# Patient Record
Sex: Male | Born: 1975 | Race: White | Hispanic: No | Marital: Married | Smoking: Never smoker
Health system: Southern US, Community
[De-identification: ages and names within clinical notes are randomized; demographics above are authoritative.]

## PROBLEM LIST (undated history)

## (undated) DIAGNOSIS — R0600 Dyspnea, unspecified: Secondary | ICD-10-CM

## (undated) DIAGNOSIS — F419 Anxiety disorder, unspecified: Secondary | ICD-10-CM

## (undated) DIAGNOSIS — Z87442 Personal history of urinary calculi: Secondary | ICD-10-CM

## (undated) DIAGNOSIS — M109 Gout, unspecified: Secondary | ICD-10-CM

## (undated) DIAGNOSIS — G5602 Carpal tunnel syndrome, left upper limb: Secondary | ICD-10-CM

## (undated) DIAGNOSIS — N200 Calculus of kidney: Secondary | ICD-10-CM

## (undated) DIAGNOSIS — K219 Gastro-esophageal reflux disease without esophagitis: Secondary | ICD-10-CM

## (undated) DIAGNOSIS — I10 Essential (primary) hypertension: Secondary | ICD-10-CM

## (undated) DIAGNOSIS — E119 Type 2 diabetes mellitus without complications: Secondary | ICD-10-CM

## (undated) DIAGNOSIS — G4733 Obstructive sleep apnea (adult) (pediatric): Secondary | ICD-10-CM

## (undated) DIAGNOSIS — M5387 Other specified dorsopathies, lumbosacral region: Secondary | ICD-10-CM

## (undated) DIAGNOSIS — L0390 Cellulitis, unspecified: Secondary | ICD-10-CM

## (undated) DIAGNOSIS — R519 Headache, unspecified: Secondary | ICD-10-CM

## (undated) DIAGNOSIS — J45909 Unspecified asthma, uncomplicated: Secondary | ICD-10-CM

## (undated) DIAGNOSIS — K589 Irritable bowel syndrome without diarrhea: Secondary | ICD-10-CM

## (undated) DIAGNOSIS — J189 Pneumonia, unspecified organism: Secondary | ICD-10-CM

## (undated) DIAGNOSIS — R51 Headache: Secondary | ICD-10-CM

## (undated) HISTORY — DX: Calculus of kidney: N20.0

## (undated) HISTORY — DX: Gout, unspecified: M10.9

## (undated) HISTORY — DX: Type 2 diabetes mellitus without complications: E11.9

## (undated) HISTORY — DX: Dyspnea, unspecified: R06.00

## (undated) HISTORY — DX: Other specified dorsopathies, lumbosacral region: M53.87

## (undated) HISTORY — DX: Obstructive sleep apnea (adult) (pediatric): G47.33

---
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
## 2004-06-12 ENCOUNTER — Ambulatory Visit: Payer: Self-pay | Admitting: Family Medicine

## 2004-10-18 ENCOUNTER — Ambulatory Visit: Payer: Self-pay | Admitting: Family Medicine

## 2004-11-07 ENCOUNTER — Encounter: Admission: RE | Admit: 2004-11-07 | Discharge: 2004-11-07 | Payer: Self-pay | Admitting: Occupational Medicine

## 2004-12-05 ENCOUNTER — Ambulatory Visit: Payer: Self-pay | Admitting: Family Medicine

## 2005-03-08 ENCOUNTER — Ambulatory Visit: Payer: Self-pay | Admitting: Family Medicine

## 2005-07-02 ENCOUNTER — Ambulatory Visit: Payer: Self-pay | Admitting: Family Medicine

## 2006-05-31 ENCOUNTER — Emergency Department (HOSPITAL_COMMUNITY): Admission: EM | Admit: 2006-05-31 | Discharge: 2006-05-31 | Payer: Self-pay | Admitting: Emergency Medicine

## 2008-11-05 ENCOUNTER — Emergency Department (HOSPITAL_COMMUNITY): Admission: EM | Admit: 2008-11-05 | Discharge: 2008-11-06 | Payer: Self-pay | Admitting: Emergency Medicine

## 2013-11-24 ENCOUNTER — Emergency Department: Payer: Self-pay | Admitting: Emergency Medicine

## 2013-11-24 LAB — CBC
HCT: 42.8 % (ref 40.0–52.0)
HGB: 14.2 g/dL (ref 13.0–18.0)
MCH: 29.4 pg (ref 26.0–34.0)
MCHC: 33.2 g/dL (ref 32.0–36.0)
MCV: 89 fL (ref 80–100)
Platelet: 184 10*3/uL (ref 150–440)
RBC: 4.83 10*6/uL (ref 4.40–5.90)
RDW: 13.8 % (ref 11.5–14.5)
WBC: 11.5 10*3/uL — AB (ref 3.8–10.6)

## 2013-11-24 LAB — BASIC METABOLIC PANEL
ANION GAP: 8 (ref 7–16)
BUN: 15 mg/dL (ref 7–18)
CALCIUM: 9 mg/dL (ref 8.5–10.1)
CHLORIDE: 105 mmol/L (ref 98–107)
Co2: 26 mmol/L (ref 21–32)
Creatinine: 1.42 mg/dL — ABNORMAL HIGH (ref 0.60–1.30)
Glucose: 141 mg/dL — ABNORMAL HIGH (ref 65–99)
Osmolality: 281 (ref 275–301)
Potassium: 3 mmol/L — ABNORMAL LOW (ref 3.5–5.1)
Sodium: 139 mmol/L (ref 136–145)

## 2013-11-24 LAB — TROPONIN I: TROPONIN-I: 0.04 ng/mL

## 2013-11-25 LAB — TROPONIN I: Troponin-I: 0.04 ng/mL

## 2013-11-25 LAB — D-DIMER(ARMC): D-Dimer: 345 ng/ml

## 2014-01-21 ENCOUNTER — Encounter (HOSPITAL_COMMUNITY): Payer: Self-pay | Admitting: Emergency Medicine

## 2014-01-21 ENCOUNTER — Inpatient Hospital Stay (HOSPITAL_COMMUNITY)
Admission: EM | Admit: 2014-01-21 | Discharge: 2014-01-22 | DRG: 194 | Disposition: A | Discharge status: Home / Self Care | Payer: Managed Care, Other (non HMO) | Attending: Internal Medicine | Admitting: Internal Medicine

## 2014-01-21 ENCOUNTER — Emergency Department (HOSPITAL_COMMUNITY): Payer: Managed Care, Other (non HMO)

## 2014-01-21 DIAGNOSIS — Z8249 Family history of ischemic heart disease and other diseases of the circulatory system: Secondary | ICD-10-CM

## 2014-01-21 DIAGNOSIS — E785 Hyperlipidemia, unspecified: Secondary | ICD-10-CM | POA: Diagnosis present

## 2014-01-21 DIAGNOSIS — I129 Hypertensive chronic kidney disease with stage 1 through stage 4 chronic kidney disease, or unspecified chronic kidney disease: Secondary | ICD-10-CM | POA: Diagnosis present

## 2014-01-21 DIAGNOSIS — N189 Chronic kidney disease, unspecified: Secondary | ICD-10-CM | POA: Diagnosis present

## 2014-01-21 DIAGNOSIS — Z6841 Body Mass Index (BMI) 40.0 and over, adult: Secondary | ICD-10-CM | POA: Diagnosis not present

## 2014-01-21 DIAGNOSIS — I509 Heart failure, unspecified: Secondary | ICD-10-CM | POA: Diagnosis present

## 2014-01-21 DIAGNOSIS — G4733 Obstructive sleep apnea (adult) (pediatric): Secondary | ICD-10-CM | POA: Diagnosis present

## 2014-01-21 DIAGNOSIS — R079 Chest pain, unspecified: Secondary | ICD-10-CM | POA: Diagnosis present

## 2014-01-21 DIAGNOSIS — R071 Chest pain on breathing: Secondary | ICD-10-CM | POA: Diagnosis present

## 2014-01-21 DIAGNOSIS — J189 Pneumonia, unspecified organism: Secondary | ICD-10-CM | POA: Diagnosis not present

## 2014-01-21 DIAGNOSIS — R7309 Other abnormal glucose: Secondary | ICD-10-CM | POA: Diagnosis present

## 2014-01-21 DIAGNOSIS — N179 Acute kidney failure, unspecified: Secondary | ICD-10-CM | POA: Diagnosis present

## 2014-01-21 DIAGNOSIS — I5030 Unspecified diastolic (congestive) heart failure: Secondary | ICD-10-CM | POA: Diagnosis present

## 2014-01-21 DIAGNOSIS — I10 Essential (primary) hypertension: Secondary | ICD-10-CM

## 2014-01-21 DIAGNOSIS — K589 Irritable bowel syndrome without diarrhea: Secondary | ICD-10-CM | POA: Diagnosis present

## 2014-01-21 DIAGNOSIS — I517 Cardiomegaly: Secondary | ICD-10-CM

## 2014-01-21 HISTORY — DX: Essential (primary) hypertension: I10

## 2014-01-21 LAB — I-STAT CHEM 8, ED
BUN: 13 mg/dL (ref 6–23)
CALCIUM ION: 1.13 mmol/L (ref 1.12–1.23)
CHLORIDE: 104 meq/L (ref 96–112)
CREATININE: 1.6 mg/dL — AB (ref 0.50–1.35)
GLUCOSE: 144 mg/dL — AB (ref 70–99)
HCT: 44 % (ref 39.0–52.0)
Hemoglobin: 15 g/dL (ref 13.0–17.0)
Potassium: 3.5 mEq/L — ABNORMAL LOW (ref 3.7–5.3)
Sodium: 140 mEq/L (ref 137–147)
TCO2: 24 mmol/L (ref 0–100)

## 2014-01-21 LAB — I-STAT TROPONIN, ED: Troponin i, poc: 0 ng/mL (ref 0.00–0.08)

## 2014-01-21 LAB — CBC
HCT: 41.7 % (ref 39.0–52.0)
Hemoglobin: 14 g/dL (ref 13.0–17.0)
MCH: 30.2 pg (ref 26.0–34.0)
MCHC: 33.6 g/dL (ref 30.0–36.0)
MCV: 90.1 fL (ref 78.0–100.0)
Platelets: 246 10*3/uL (ref 150–400)
RBC: 4.63 MIL/uL (ref 4.22–5.81)
RDW: 13.4 % (ref 11.5–15.5)
WBC: 10.9 10*3/uL — ABNORMAL HIGH (ref 4.0–10.5)

## 2014-01-21 LAB — D-DIMER, QUANTITATIVE (NOT AT ARMC): D DIMER QUANT: 0.34 ug{FEU}/mL (ref 0.00–0.48)

## 2014-01-21 LAB — TROPONIN I
Troponin I: <0.3 ng/mL (ref <0.30)
Troponin I: <0.3 ng/mL (ref <0.30)

## 2014-01-21 LAB — CBC WITH DIFFERENTIAL/PLATELET
Basophils Absolute: 0.1 10*3/uL (ref 0.0–0.1)
Basophils Relative: 1 % (ref 0–1)
EOS ABS: 0.3 10*3/uL (ref 0.0–0.7)
EOS PCT: 2 % (ref 0–5)
HEMATOCRIT: 40.7 % (ref 39.0–52.0)
Hemoglobin: 13.6 g/dL (ref 13.0–17.0)
LYMPHS ABS: 1.9 10*3/uL (ref 0.7–4.0)
LYMPHS PCT: 17 % (ref 12–46)
MCH: 30 pg (ref 26.0–34.0)
MCHC: 33.4 g/dL (ref 30.0–36.0)
MCV: 89.8 fL (ref 78.0–100.0)
MONO ABS: 1.1 10*3/uL — AB (ref 0.1–1.0)
Monocytes Relative: 10 % (ref 3–12)
NEUTROS ABS: 8 10*3/uL — AB (ref 1.7–7.7)
NEUTROS PCT: 70 % (ref 43–77)
PLATELETS: 266 10*3/uL (ref 150–400)
RBC: 4.53 MIL/uL (ref 4.22–5.81)
RDW: 13.5 % (ref 11.5–15.5)
WBC: 11.3 10*3/uL — ABNORMAL HIGH (ref 4.0–10.5)

## 2014-01-21 LAB — CREATININE, SERUM
Creatinine, Ser: 1.03 mg/dL (ref 0.50–1.35)
GFR calc non Af Amer: >90 mL/min (ref >90)

## 2014-01-21 LAB — HEMOGLOBIN A1C
HEMOGLOBIN A1C: 6.1 % — AB (ref <5.7)
Mean Plasma Glucose: 128 mg/dL — ABNORMAL HIGH (ref <117)

## 2014-01-21 LAB — PRO B NATRIURETIC PEPTIDE: Pro B Natriuretic peptide (BNP): 182.5 pg/mL — ABNORMAL HIGH (ref 0–125)

## 2014-01-21 LAB — MRSA PCR SCREENING: MRSA by PCR: NEGATIVE

## 2014-01-21 MED ORDER — ALBUTEROL SULFATE (2.5 MG/3ML) 0.083% IN NEBU
5.0000 mg | INHALATION_SOLUTION | Freq: Once | RESPIRATORY_TRACT | Status: AC
  Administered 2014-01-21: 5 mg via RESPIRATORY_TRACT
  Filled 2014-01-21: qty 6

## 2014-01-21 MED ORDER — MORPHINE SULFATE 2 MG/ML IJ SOLN
2.0000 mg | INTRAMUSCULAR | Status: DC | PRN
  Administered 2014-01-21 – 2014-01-22 (×3): 2 mg via INTRAVENOUS
  Filled 2014-01-21 (×3): qty 1

## 2014-01-21 MED ORDER — SODIUM CHLORIDE 0.9 % IV SOLN: INTRAVENOUS | Status: DC

## 2014-01-21 MED ORDER — IOHEXOL 350 MG/ML SOLN
80.0000 mL | Freq: Once | INTRAVENOUS | Status: AC | PRN
  Administered 2014-01-21: 80 mL via INTRAVENOUS

## 2014-01-21 MED ORDER — CARVEDILOL 6.25 MG PO TABS
6.2500 mg | ORAL_TABLET | Freq: Two times a day (BID) | ORAL | Status: DC
  Administered 2014-01-21 – 2014-01-22 (×2): 6.25 mg via ORAL
  Filled 2014-01-21 (×4): qty 1

## 2014-01-21 MED ORDER — ONDANSETRON HCL 4 MG PO TABS: 4.0000 mg | ORAL_TABLET | Freq: Four times a day (QID) | ORAL | Status: DC | PRN

## 2014-01-21 MED ORDER — ATORVASTATIN CALCIUM 40 MG PO TABS
40.0000 mg | ORAL_TABLET | Freq: Every day | ORAL | Status: DC
  Administered 2014-01-21 – 2014-01-22 (×2): 40 mg via ORAL
  Filled 2014-01-21 (×2): qty 1

## 2014-01-21 MED ORDER — ACETAMINOPHEN 650 MG RE SUPP: 650.0000 mg | Freq: Four times a day (QID) | RECTAL | Status: DC | PRN

## 2014-01-21 MED ORDER — ASPIRIN EC 325 MG PO TBEC
325.0000 mg | DELAYED_RELEASE_TABLET | Freq: Every day | ORAL | Status: DC
Start: 2014-01-21 — End: 2014-01-22
  Administered 2014-01-21 – 2014-01-22 (×2): 325 mg via ORAL
  Filled 2014-01-21 (×2): qty 1

## 2014-01-21 MED ORDER — HYDROMORPHONE HCL PF 1 MG/ML IJ SOLN
1.0000 mg | Freq: Once | INTRAMUSCULAR | Status: AC
  Administered 2014-01-21: 1 mg via INTRAVENOUS
  Filled 2014-01-21: qty 1

## 2014-01-21 MED ORDER — METHYLPREDNISOLONE SODIUM SUCC 125 MG IJ SOLR
125.0000 mg | Freq: Once | INTRAMUSCULAR | Status: AC
  Administered 2014-01-21: 125 mg via INTRAVENOUS
  Filled 2014-01-21: qty 2

## 2014-01-21 MED ORDER — ALBUTEROL SULFATE (2.5 MG/3ML) 0.083% IN NEBU: 2.5000 mg | INHALATION_SOLUTION | RESPIRATORY_TRACT | Status: DC | PRN

## 2014-01-21 MED ORDER — NITROGLYCERIN 0.4 MG SL SUBL: 0.4000 mg | SUBLINGUAL_TABLET | SUBLINGUAL | Status: DC | PRN

## 2014-01-21 MED ORDER — SODIUM CHLORIDE 0.9 % IJ SOLN
3.0000 mL | Freq: Two times a day (BID) | INTRAMUSCULAR | Status: DC
  Administered 2014-01-21 – 2014-01-22 (×2): 3 mL via INTRAVENOUS

## 2014-01-21 MED ORDER — NITROGLYCERIN IN D5W 200-5 MCG/ML-% IV SOLN
2.0000 ug/min | INTRAVENOUS | Status: DC
  Administered 2014-01-21: 5 ug/min via INTRAVENOUS
  Filled 2014-01-21: qty 250

## 2014-01-21 MED ORDER — ONDANSETRON HCL 4 MG/2ML IJ SOLN: 4.0000 mg | Freq: Four times a day (QID) | INTRAMUSCULAR | Status: DC | PRN

## 2014-01-21 MED ORDER — PERFLUTREN LIPID MICROSPHERE
1.0000 mL | Freq: Once | INTRAVENOUS | Status: AC
  Administered 2014-01-21: 1 mL via INTRAVENOUS
  Filled 2014-01-21: qty 10

## 2014-01-21 MED ORDER — MORPHINE SULFATE 4 MG/ML IJ SOLN
4.0000 mg | Freq: Once | INTRAMUSCULAR | Status: DC
  Filled 2014-01-21: qty 1

## 2014-01-21 MED ORDER — OXYCODONE HCL 5 MG PO TABS
5.0000 mg | ORAL_TABLET | ORAL | Status: DC | PRN
  Administered 2014-01-22 (×2): 5 mg via ORAL
  Filled 2014-01-21 (×2): qty 1

## 2014-01-21 MED ORDER — ENOXAPARIN SODIUM 40 MG/0.4ML ~~LOC~~ SOLN
40.0000 mg | SUBCUTANEOUS | Status: DC
  Administered 2014-01-21: 40 mg via SUBCUTANEOUS
  Filled 2014-01-21 (×2): qty 0.4

## 2014-01-21 MED ORDER — PERFLUTREN LIPID MICROSPHERE
1.0000 mL | INTRAVENOUS | Status: AC | PRN
  Filled 2014-01-21: qty 10

## 2014-01-21 MED ORDER — SODIUM CHLORIDE 0.9 % IV BOLUS (SEPSIS): 1000.0000 mL | Freq: Once | INTRAVENOUS | Status: DC

## 2014-01-21 MED ORDER — KETOROLAC TROMETHAMINE 30 MG/ML IJ SOLN
30.0000 mg | Freq: Once | INTRAMUSCULAR | Status: AC
  Administered 2014-01-21: 30 mg via INTRAVENOUS
  Filled 2014-01-21: qty 1

## 2014-01-21 MED ORDER — PANTOPRAZOLE SODIUM 40 MG PO TBEC
40.0000 mg | DELAYED_RELEASE_TABLET | Freq: Every day | ORAL | Status: DC
  Administered 2014-01-21 – 2014-01-22 (×2): 40 mg via ORAL
  Filled 2014-01-21 (×2): qty 1

## 2014-01-21 MED ORDER — GI COCKTAIL ~~LOC~~
30.0000 mL | Freq: Once | ORAL | Status: AC
  Administered 2014-01-21: 30 mL via ORAL
  Filled 2014-01-21: qty 30

## 2014-01-21 MED ORDER — ISOSORBIDE MONONITRATE ER 30 MG PO TB24
30.0000 mg | ORAL_TABLET | Freq: Every day | ORAL | Status: DC
  Administered 2014-01-21: 30 mg via ORAL
  Filled 2014-01-21 (×2): qty 1

## 2014-01-21 MED ORDER — ACETAMINOPHEN 500 MG PO TABS: 1000.0000 mg | ORAL_TABLET | Freq: Once | ORAL | Status: DC

## 2014-01-21 MED ORDER — ACETAMINOPHEN 325 MG PO TABS: 650.0000 mg | ORAL_TABLET | Freq: Four times a day (QID) | ORAL | Status: DC | PRN

## 2014-01-21 MED ORDER — LEVOFLOXACIN IN D5W 500 MG/100ML IV SOLN
500.0000 mg | INTRAVENOUS | Status: DC
  Administered 2014-01-21: 500 mg via INTRAVENOUS
  Filled 2014-01-21 (×2): qty 100

## 2014-01-21 MED ORDER — GUAIFENESIN-DM 100-10 MG/5ML PO SYRP
5.0000 mL | ORAL_SOLUTION | ORAL | Status: DC | PRN
  Filled 2014-01-21: qty 5

## 2014-01-21 MED ORDER — MORPHINE SULFATE 2 MG/ML IJ SOLN
1.0000 mg | INTRAMUSCULAR | Status: DC | PRN
  Administered 2014-01-21 (×2): 1 mg via INTRAVENOUS
  Filled 2014-01-21 (×2): qty 1

## 2014-01-21 NOTE — Progress Notes (Signed)
Pt c/o chest pain 6/10 throughout shift. Nitro titrated to 6525mcg/min. Pt sleeps briefly and awakens with chest pain. Some improvement noted when OOB in chair. MD notified at 16:00. Family remain at bedside.

## 2014-01-21 NOTE — ED Notes (Signed)
Pt arrives via EMS from home. States that he went to lay down and had a sudden onset of chest pain, shortness of breath, diaphoresis, nausea and vomitting. Says that he also started coughing up bright red sputum which has since subsided. States that same thing happened the other day and it went away after a few minutes. EMS gave pt 324 ASA and 2 NTG with no changes in pain. Pain is 8/10. EMS also gave 4mg  Zofran which has helped the nausea. States he has a hx of swollen legs and HTN. States he takes HCTZ, carvedilol and lisinopril. States he was started on a new BP medicine but hasn't taken it in a few weeks because he couldn't tell the difference. EMS reports 12 lead unremarkable. Initial BP 196/106, currently 157/95.

## 2014-01-21 NOTE — Progress Notes (Signed)
ANTIBIOTIC CONSULT NOTE - INITIAL  Pharmacy Consult for levaquin Indication: CAP  Allergies  Allergen Reactions  . Penicillins Hives    Patient Measurements: Height: 5\' 8"  (172.7 cm) Weight: 364 lb 4.8 oz (165.245 kg) IBW/kg (Calculated) : 68.4  Vital Signs: Temp: 97.8 F (36.6 C) (07/30 1216) Temp src: Oral (07/30 1216) BP: 156/95 mmHg (07/30 1216) Pulse Rate: 76 (07/30 1216) Intake/Output from previous day:   Intake/Output from this shift: Total I/O In: 17.6 [I.V.:17.6] Out: -   Labs:  Recent Labs  01/21/14 0128 01/21/14 0137  WBC 11.3*  --   HGB 13.6 15.0  PLT 266  --   CREATININE  --  1.60*   Estimated Creatinine Clearance: 94.8 ml/min (by C-G formula based on Cr of 1.6). No results found for this basename: VANCOTROUGH, Leodis BinetVANCOPEAK, VANCORANDOM, GENTTROUGH, GENTPEAK, GENTRANDOM, TOBRATROUGH, TOBRAPEAK, TOBRARND, AMIKACINPEAK, AMIKACINTROU, AMIKACIN,  in the last 72 hours   Microbiology: Recent Results (from the past 720 hour(s))  MRSA PCR SCREENING     Status: None   Collection Time    01/21/14 10:00 AM      Result Value Ref Range Status   MRSA by PCR NEGATIVE  NEGATIVE Final   Comment:            The GeneXpert MRSA Assay (FDA     approved for NASAL specimens     only), is one component of a     comprehensive MRSA colonization     surveillance program. It is not     intended to diagnose MRSA     infection nor to guide or     monitor treatment for     MRSA infections.    Medical History: Past Medical History  Diagnosis Date  . Hypertension   . Swelling of lower extremity     Medications:  Prescriptions prior to admission  Medication Sig Dispense Refill  . carvedilol (COREG) 25 MG tablet Take 25 mg by mouth 2 (two) times daily with a meal.      . ibuprofen (ADVIL,MOTRIN) 200 MG tablet Take 400 mg by mouth every 6 (six) hours as needed for moderate pain.      Marland Kitchen. lisinopril-hydrochlorothiazide (PRINZIDE,ZESTORETIC) 20-12.5 MG per tablet Take 1  tablet by mouth daily.      Marland Kitchen. losartan-hydrochlorothiazide (HYZAAR) 100-25 MG per tablet Take 1 tablet by mouth daily.        Assessment: 38 yo male here with CP and SOB and possible PNA to begin levaquin per pharmacy.  WBC= 11.3, SCr= 1.6, CrCl ~ 95.   Plan:  -Levaquin 500mg  IV q24hr -Will follow renal function, cultures and clinical progress  Harland Germanndrew Laraina Sulton, Pharm D 01/21/2014 2:22 PM

## 2014-01-21 NOTE — H&P (Addendum)
PATIENT DETAILS Name: Troy Rogers Age: 38 y.o. Sex: male Date of Birth: 1976-03-01 Admit Date: 01/21/2014 PCP:No PCP Per Patient   CHIEF COMPLAINT:  Chest pain and hemoptysis   HPI: Troy Rogers is a 38 y.o. morbidly obese male with a Past Medical History of Hypertension and IBS who presents today with the above noted complaint.  States he was sleeping last night and suddenly could not breath.  Felt like there was a "car sitting on his chest." Abruptly sat up and started to cough up blood.  Describes it as "chunks" of blood that later became pinking phlegm.  States pain is right in the middle of his chest and does not radiate.  Patient became diaphoretic and nausea, but did not vomit.  Denies previous history of similar episodes.  No recent change in activity or diet.  Rates the pain after Nitro administration a 6-6.5/10.  No hx of obstructive apnea and does not wear a cpap.  Previously negative sleep study.     Denies any history of fever, does have a dry cough.   ALLERGIES:   Allergies  Allergen Reactions  . Penicillins Hives    PAST MEDICAL HISTORY: Past Medical History  Diagnosis Date  . Hypertension   . Swelling of lower extremity     PAST SURGICAL HISTORY: History reviewed. No pertinent past surgical history.  MEDICATIONS AT HOME: Prior to Admission medications   Medication Sig Start Date End Date Taking? Authorizing Provider  CARVEDILOL PO Take 1 tablet by mouth 2 (two) times daily. Strength unknown   Yes Historical Provider, MD  ibuprofen (ADVIL,MOTRIN) 200 MG tablet Take 400 mg by mouth every 6 (six) hours as needed for moderate pain.   Yes Historical Provider, MD  LISINOPRIL-HYDROCHLOROTHIAZIDE PO Take 1 tablet by mouth daily. Strength unknown   Yes Historical Provider, MD  PRESCRIPTION MEDICATION Take 1 tablet by mouth daily. Unknown name of blood pressure medication   Yes Historical Provider, MD    FAMILY HISTORY: History reviewed. Maternal and  Paternal grandfathers with heart disease.  Both grandmothers with cancer.  Neither parents with any significant medical conditions.  SOCIAL HISTORY:  reports that he has never smoked. He has never used smokeless tobacco. He reports that he drinks alcohol. He reports that he does not use illicit drugs.  REVIEW OF SYSTEMS:  Constitutional:   No  weight loss, Fevers, chills, fatigue.  HEENT:    Difficulty swallowing, Sore throat,  No sneezing, itching, ear ache, nasal congestion, post nasal drip,   Cardio-vascular: No Orthopnea, anasarca, dizziness, palpitations  GI:  No heartburn, indigestion, abdominal pain, vomiting, diarrhea, change in bowel habits, loss of appetite  Resp: No excess mucus, No wheezing. No chest wall deformity  Skin:  no rash or lesions.  GU:  no dysuria, change in color of urine, no urgency or frequency.  No flank pain.  Musculoskeletal: No joint pain or swelling.  No decreased range of motion.  No back pain.  Psych: No change in mood or affect. No depression or anxiety.  No memory loss.   PHYSICAL EXAM: Blood pressure 155/84, pulse 73, temperature 98.2 F (36.8 C), temperature source Oral, resp. rate 21, SpO2 96.00%.  General appearance :somnloent but arousable, alert, not in any distress. Speech Clear. Not toxic Looking. Mobidly obese sitting up in bed HEENT: Atraumatic and Normocephalic, pupils equally reactive to light and accomodation Neck: supple, no JVD. No cervical lymphadenopathy.  Chest: Good air entry bilaterally, mild  wheezing noted. No crackles or rales CVS: S1 S2 regular, no murmurs.  Abdomen: Bowel sounds present, Non tender and not distended with no gaurding, rigidity or rebound. Extremities: B/L Lower Ext shows mild edema, both legs are warm to touch Neurology: oriented X 3, CN II-XII intact, Non focal Skin:No Rash   LABS ON ADMISSION:   Recent Labs  01/21/14 0137  NA 140  K 3.5*  CL 104  GLUCOSE 144*  BUN 13  CREATININE  1.60*   No results found for this basename: AST, ALT, ALKPHOS, BILITOT, PROT, ALBUMIN,  in the last 72 hours No results found for this basename: LIPASE, AMYLASE,  in the last 72 hours  Recent Labs  01/21/14 0128 01/21/14 0137  WBC 11.3*  --   NEUTROABS 8.0*  --   HGB 13.6 15.0  HCT 40.7 44.0  MCV 89.8  --   PLT 266  --    No results found for this basename: CKTOTAL, CKMB, CKMBINDEX, TROPONINI,  in the last 72 hours  Recent Labs  01/21/14 0323  DDIMER 0.34   No components found with this basename: POCBNP,    RADIOLOGIC STUDIES ON ADMISSION: Dg Chest 2 View  01/21/2014   CLINICAL DATA:  Shortness of breath and chest pain.  Hemoptysis.  EXAM: CHEST  2 VIEW  COMPARISON:  11/24/2013  FINDINGS: The heart size and mediastinal contours are within normal limits. Both lungs are clear. The visualized skeletal structures are unremarkable.  IMPRESSION: Normal exam.   Electronically Signed   By: Jim  Maxwell M.D.   On: 01/21/2014 02:15   Ct Angio Chest Pe W/cm &/or Wo Cm  01/21/2014   CLINICAL DATA:  Shortness of breath and chest pain.  EXAM: CT ANGIOGRAPHY CHEST WITH CONTRAST  TECHNIQUE: Multidetector CT imaging of the chest was performed using the standard protocol during bolus administration of intravenous contrast. Multiplanar CT image reconstructions and MIPs were obtained to evaluate the vascular anatomy.  CONTRAST:  65mLMa1610DerwoMedstar Surgery Center At 26mimMa1610DerwoVance Thompson [facility] Bill59mngMa1610DerwoSaint Clares Hospital - Susse60m CMa1610DerwoWest Park Surgery C6mntMa1610DerwoAv[place] 68mosMa1610DerwoGreen Valley Surger74m CMa1610DerwoAd[facility] 52mosMa1610DerwoVictor Valley Global Medica34m CMa1610DerwoAlaska Digestiv29m CMa1610DerwoAspire Behavioral Health O48m CMa1610DerwoNorth Valley Endoscop89m CMa1610DerwoAlvarado Parkway Institut13m BMa1610DerwoSurgery Center Of Cul62mmaMa1610DerwoGrand Street Gastroentero36mogMa1610DerwoEncompass Health [facility] Of5mAbMa1610DerwoPres[facility] 49mosMa1610DerwoUpmc 47memMa1610DerwoLandmark Surger107m CMa1610DerwoBroward Health Imperi11ml Ma1610DerwoChildrens Medical Cent361610De77mwoMa1610DerwoKindred Hospital - [facility] - Morri64m CMa1610DerwoBay [facility] Medical87mCeMa1610DerwoWhite Flint Sur34merMa1610DerwoSw[facility] 43mosMa1610DerwoSurgcenter Of Silver Sp48minMa1610DerwoMain Line Endoscopy Center SouthplanE IOHEXOL 350 MG/ML SOLN  COMPARISON:  Chest x-ray 01/21/2014.  FINDINGS: Thoracic aorta is unremarkable. Great vessels patent. Pulmonary arteries are normal. No pulmonary embolus. Cardiomegaly. No pericardial effusion .  No significant mediastinal or hilar adenopathy. Thoracic esophagus is normal.  Large airways are patent. Minimal interstitial prominence is noted throughout the lungs. These findings suggest process such as interstitial edema from congestive heart failure or pneumonitis.  Visualized upper abdominal structures are unremarkable.  Visualized thyroid  unremarkable. No supraclavicular or axillary adenopathy. Degenerative changes thoracic spine. No acute bony abnormality.  Review of the MIP images confirms the above findings.  IMPRESSION: 1. Cardiomegaly with mild interstitial prominence suggesting congestive heart failure. Pneumonitis cannot be excluded. 2. No evidence of pulmonary embolus.   Electronically Signed   By: Thomas  Register   On: 01/21/2014 07:12     EKG: Independently reviewed.   ASSESSMENT AND PLAN: Present on Admission:  . Chest pain with shortness of breath -Suspecting cardiology etiology with patient's history of chest pain, SOB, diaphoresis, and nausea. Also has questionable atypical pneumonia on CT chest, do not think this accounts for all of the patient's symptoms. -Cardiology consulted. On Nitro drip.  Put on NPO for now till seen by cardiology. Check echocardiogram, however BNP only minimally elevated. -EKG shows no abnormalities.  Troponin pending.  CT shows no evidence of Pulmonary Embolus  . Possible atypical pneumonia -CT cannot rule out pneumonitis- however is afebrile and without any leukocytosis.  - Given hemoptysis, will start on Levaquin IV and follow  clinical course  .Hemoptysis - Suspect secondary to possible atypical pneumonia or an upper airway lesion. No endobronchial lesions or any obvious parenchymal lung lesions seen on CT scan. Since this has resolved, will continue to monitor closely. Will treat this as potential atypical pneumonia, and follow. If significant hemoptysis recurs, will consult pulmonology.  . Acute vs chronic kidney disease - Creatinine elevated at 1.6, not sure if this is acute renal failure or chronic kidney disease at this point. No prior labs to compare. - Check UA, follow creatinine trend. If he stays persistently elevated, could have chronic kidney disease. However he did receive contrast in the emergency room for a CT angiogram, creatinine could potentially worsen. Clinically  euvolemic at this time, do not think he needs IV fluids. Avoid restarting losartan or HCTZ at this point.  . Hypertension - Currently on a nitroglycerin infusion, once more stable will titrate off the nitroglycerin, and resume oral antihypertensives  . Morbid obesity - Counseled regarding importance of weight loss - Suspect underlying OSA-will need outpatient sleep study  Further plan will depend as patient's clinical course evolves and further radiologic and laboratory data become available. Patient will be monitored closely.  Above noted plan was discussed with patient/family they were in agreement.   DVT Prophylaxis: Prophylactic Lovenox   Code Status: Full Code  Total time spent for admission equals 45 minutes.  Collene Leydenariq, Waqas, PA-S, [facility] Pager [phone]  If 7PM-7AM, please contact night-coverage [email or web address] Password [email or web address] 01/21/2014, 7:52 AM  **Disclaimer: This note may have been dictated with voice recognition software. Similar sounding words can inadvertently be transcribed and this note may contain transcription errors which may not have been corrected upon publication of note.**  Attending Patient was seen, examined,treatment plan was discussed with the Physician extender. I have directly reviewed the clinical findings, lab, imaging studies and management of this patient in detail. I have made the necessary changes to the above noted documentation, and agree with the documentation, as recorded by the Physician extender.  Windell NorfolkS Meri Pelot MD [facility].

## 2014-01-21 NOTE — Consult Note (Signed)
CARDIOLOGY CONSULT NOTE   Patient ID: Troy Rogers MRN: [id number], DOB/AGE: 1975/06/27   Admit date: 01/21/2014 Date of Consult: 01/21/2014   Primary Physician[place] Primary Cardiologist: None  Pt. Profile  38 year old morbidly obese patient presents with sudden severe chest pain and hemoptysis.  Problem List  Past Medical History  Diagnosis Date  . Hypertension   . Swelling of lower extremity     History reviewed. No pertinent past surgical history.   Allergies  Allergies  Allergen Reactions  . Penicillins Hives    HPI  This 38 year old gentleman has a history of hypertension since age 38. He is followed [place]. He is on carvedilol and lisinopril/HCT. No prior history of diabetes, or heart trouble. Has morbid obesity weighing close to 350 lb. Usual state of health. Last night awoke with sudden 10/10 chest pain and sat straight up in bed. Was short of breath and coughed up bright blood several times then sputum turned pink. Still having 6/10 chest pain. EKG shows no ischemic changes. CT angio is negative for pulmonary embolus. Shows cardiomegaly with mild interstitial prominence. Th  Inpatient Medications     Family History History reviewed. No pertinent family history. Grandparents had heart disease. Parents have no heart disease.   Social History History   Social History  . Marital Status: Married    Spouse Name: N/A    Number of Children: N/A  . Years of Education: N/A   Occupational History  . Not on file. 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   Social History Main Topics  . Smoking status: Never Smoker   . Smokeless tobacco: Never Used  . Alcohol Use: Yes     Comment: ocassionally   . Drug Use: No  . Sexual Activity: Not on file   Other Topics Concern  . Not on file   Social History Narrative  . No narrative on file     Review of Systems  General:  No chills, fever, night sweats or  weight changes.  Cardiovascular:  No  dyspnea on exertion, edema, orthopnea, palpitations, paroxysmal nocturnal dyspnea. Dermatological: No rash, lesions/masses Respiratory: Sudden cough with hemoptysis last night. Urologic: No hematuria, dysuria Abdominal:   No nausea, vomiting, diarrhea, bright red blood per rectum, melena, or hematemesis Neurologic:  No visual changes, wkns, changes in mental status. All other systems reviewed and are otherwise negative except as noted above.  Physical Exam  Blood pressure 155/84, pulse 73, temperature 98.2 F (36.8 C), temperature source Oral, resp. rate 21, SpO2 96.00%.  General: Pleasant, NAD.  Morbidly obese. Stated weight 348. Psych: Normal affect. Neuro: Alert and oriented X 3. Moves all extremities spontaneously. HEENT: Normal  Neck: Supple without bruits or JVD. Lungs:  Resp regular and unlabored, CTA. Heart: RRR no s3, s4, or murmurs. Mild chest wall tenderness. Abdomen: Soft, non-tender, non-distended, BS + x 4.  Extremities: No clubbing, cyanosis or edema. DP/PT/Radials 2+ and equal bilaterally.  Labs  No results found for this basename: CKTOTAL, CKMB, TROPONINI,  in the last 72 hours Lab Results  Component Value Date   WBC 11.3* 01/21/2014   HGB 15.0 01/21/2014   HCT 44.0 01/21/2014   MCV 89.8 01/21/2014   PLT 266 01/21/2014     Recent Labs Lab 01/21/14 0137  NA 140  K 3.5*  CL 104  BUN 13  CREATININE 1.60*  GLUCOSE 144*   No results found  for this basename: CHOL,  HDL,  LDLCALC,  TRIG   Lab Results  Component Value Date   DDIMER 0.34 01/21/2014    Radiology/Studies  Dg Chest 2 View  01/21/2014   CLINICAL DATA:  Shortness of breath and chest pain.  Hemoptysis.  EXAM: CHEST  2 VIEW  COMPARISON:  11/24/2013  FINDINGS: The heart size and mediastinal contours are within normal limits. Both lungs are clear. The visualized skeletal structures are unremarkable.  IMPRESSION: Normal exam.   Electronically Signed   By: Geanie Cooley M.D.   On: 01/21/2014 02:15   Ct Angio Chest Pe W/cm &/or Wo Cm  01/21/2014   CLINICAL DATA:  Shortness of breath and chest pain.  EXAM: CT ANGIOGRAPHY CHEST WITH CONTRAST  TECHNIQUE: Multidetector CT imaging of the chest was performed using the standard protocol during bolus administration of intravenous contrast. Multiplanar CT image reconstructions and MIPs were obtained to evaluate the vascular anatomy.  CONTRAST:  80mL OMNIPAQUE IOHEXOL 350 MG/ML SOLN  COMPARISON:  Chest x-ray 01/21/2014.  FINDINGS: Thoracic aorta is unremarkable. Great vessels patent. Pulmonary arteries are normal. No pulmonary embolus. Cardiomegaly. No pericardial effusion .  No significant mediastinal or hilar adenopathy. Thoracic esophagus is normal.  Large airways are patent. Minimal interstitial prominence is noted throughout the lungs. These findings suggest process such as interstitial edema from congestive heart failure or pneumonitis.  Visualized upper abdominal structures are unremarkable.  Visualized thyroid unremarkable. No supraclavicular or axillary adenopathy. Degenerative changes thoracic spine. No acute bony abnormality.  Review of the MIP images confirms the above findings.  IMPRESSION: 1. Cardiomegaly with mild interstitial prominence suggesting congestive heart failure. Pneumonitis cannot be excluded. 2. No evidence of pulmonary embolus.   Electronically Signed   By: Maisie Fus  Register   On: 01/21/2014 07:12    ECG  Sinus rhythm Low voltage, precordial leads Confirmed by [facility]  ASSESSMENT AND PLAN  1. Chest pain uncertain etiology. Does not suggest coronary ischemia to me. Sudden onset associated with immediate hemoptysis.  CT angio negative for acute process. Possible early pneumonitis? WBC is mildly elevated. Afebrile. 2. Morbid obesity. 3. Hyperglycemia (not known to be diabetic).  4. Essential hypertension. 5. Possible mild CHF. Pro-BNP only borderline elevated 182. 6. Mild renal  insufficiency.  Plan: Admit on medical service. Will get serial troponins and repeat chest xray. Will get 2D echo. Consider pulmonary consult re hemoptysis. Signed, Cassell Clement, MD  01/21/2014, 8:28 AM

## 2014-01-21 NOTE — ED Provider Notes (Signed)
CSN: [id number]     Arrival date & time 01/21/14  0117 History   First MD Initiated Contact with Patient 01/21/14 0143     Chief Complaint  Patient presents with  . Shortness of Breath  . Chest Pain     (Consider location/radiation/quality/duration/timing/severity/associated sxs/prior Treatment) Patient is a 38 y.o. male presenting with shortness of breath and chest pain. The history is provided by the patient. No language interpreter was used.  Shortness of Breath Severity:  Severe Onset quality:  Sudden Timing:  Constant Progression:  Unchanged Chronicity:  New Context comment:  Sexual activity Relieved by:  Nothing Worsened by:  Nothing tried Ineffective treatments:  Rest, sitting up and oxygen Associated symptoms: chest pain, diaphoresis and wheezing   Associated symptoms: no fever   Associated symptoms comment:  Hemoptysis and chest pressure Chest pain:    Quality:  Dull   Severity:  Severe   Onset quality:  Sudden   Timing:  Constant   Progression:  Unchanged   Chronicity:  New Risk factors: no prolonged immobilization and no tobacco use   Chest Pain Associated symptoms: diaphoresis and shortness of breath   Associated symptoms: no fever and no palpitations     Past Medical History  Diagnosis Date  . Hypertension   . Swelling of lower extremity    History reviewed. No pertinent past surgical history. History reviewed. No pertinent family history. History  Substance Use Topics  . Smoking status: Never Smoker   . Smokeless tobacco: Never Used  . Alcohol Use: Yes     Comment: ocassionally     Review of Systems  Constitutional: Positive for diaphoresis. Negative for fever.  Respiratory: Positive for shortness of breath and wheezing.   Cardiovascular: Positive for chest pain. Negative for palpitations and leg swelling.  All other systems reviewed and are negative.     Allergies  Penicillins  Home Medications   Prior to Admission medications    Medication Sig Start Date End Date Taking? Authorizing Provider  CARVEDILOL PO Take 1 tablet by mouth 2 (two) times daily. Strength unknown   Yes Historical Provider, MD  ibuprofen (ADVIL,MOTRIN) 200 MG tablet Take 400 mg by mouth every 6 (six) hours as needed for moderate pain.   Yes Historical Provider, MD  LISINOPRIL-HYDROCHLOROTHIAZIDE PO Take 1 tablet by mouth daily. Strength unknown   Yes Historical Provider, MD  PRESCRIPTION MEDICATION Take 1 tablet by mouth daily. Unknown name of blood pressure medication   Yes Historical Provider, MD   BP 141/81  Pulse 57  Resp 17  SpO2 96% Physical Exam  Constitutional: He is oriented to person, place, and time. He appears well-developed and well-nourished.  HENT:  Head: Normocephalic and atraumatic.  Mouth/Throat: Oropharynx is clear and moist.  Eyes: Conjunctivae and EOM are normal. Pupils are equal, round, and reactive to light.  Neck: Normal range of motion. Neck supple.  Cardiovascular: Normal rate, regular rhythm and intact distal pulses.   Pulmonary/Chest: No respiratory distress. He has wheezes. He has no rales.  Abdominal: Soft. Bowel sounds are normal. There is no tenderness. There is no rebound and no guarding.  Musculoskeletal: Normal range of motion. He exhibits no edema.  Neurological: He is alert and oriented to person, place, and time.  Skin: He is diaphoretic. No erythema.  Psychiatric: He has a normal mood and affect.    ED Course  Procedures (including critical care time) Labs Review Labs Reviewed  CBC WITH DIFFERENTIAL - Abnormal; Notable for the following:  WBC 11.3 (*)    Neutro Abs 8.0 (*)    Monocytes Absolute 1.1 (*)    All other components within normal limits  PRO B NATRIURETIC PEPTIDE - Abnormal; Notable for the following:    Pro B Natriuretic peptide (BNP) 182.5 (*)    All other components within normal limits  I-STAT CHEM 8, ED - Abnormal; Notable for the following:    Potassium 3.5 (*)     Creatinine, Ser 1.60 (*)    Glucose, Bld 144 (*)    All other components within normal limits  D-DIMER, QUANTITATIVE  I-STAT TROPOININ, ED  Rosezena SensorI-STAT TROPOININ, ED    Imaging Review Dg Chest 2 View  01/21/2014   CLINICAL DATA:  Shortness of breath and chest pain.  Hemoptysis.  EXAM: CHEST  2 VIEW  COMPARISON:  11/24/2013  FINDINGS: The heart size and mediastinal contours are within normal limits. Both lungs are clear. The visualized skeletal structures are unremarkable.  IMPRESSION: Normal exam.   Electronically Signed   By: Geanie CooleyJim  Maxwell M.D.   On: 01/21/2014 02:15     EKG Interpretation   Date/Time:  Thursday January 21 2014 01:24:38 EDT Ventricular Rate:  84 PR Interval:  183 QRS Duration: 90 QT Interval:  363 QTC Calculation: 429 R Axis:   65 Text Interpretation:  Sinus rhythm Low voltage, precordial leads Confirmed  by [facility]  MD, Staysha Truby ([id number]) on 01/21/2014 3:16:34 AM      MDM   Final diagnoses:  None    Medications  nitroGLYCERIN (NITROSTAT) SL tablet 0.4 mg (not administered)  nitroGLYCERIN 50 mg in dextrose 5 % 250 mL (0.2 mg/mL) infusion (5 mcg/min Intravenous New Bag/Given 01/21/14 0640)  gi cocktail (Maalox,Lidocaine,Donnatal) (30 mLs Oral Given 01/21/14 0328)  albuterol (PROVENTIL) (2.5 MG/3ML) 0.083% nebulizer solution 5 mg (5 mg Nebulization Given 01/21/14 0421)  ketorolac (TORADOL) 30 MG/ML injection 30 mg (30 mg Intravenous Given 01/21/14 0455)  methylPREDNISolone sodium succinate (SOLU-MEDROL) 125 mg/2 mL injection 125 mg (125 mg Intravenous Given 01/21/14 0457)  HYDROmorphone (DILAUDID) injection 1 mg (1 mg Intravenous Given 01/21/14 0559)  iohexol (OMNIPAQUE) 350 MG/ML injection 80 mL (80 mLs Intravenous Contrast Given 01/21/14 0652)  case d/w Dr. Andria MeuseStevens, reduced contrast bolus Case d/w Dr. Lovell SheehanJenkins admit to step down    Porter Moes Smitty CordsK Demani Mcbrien-Rasch, MD 01/21/14 [phone]

## 2014-01-21 NOTE — Progress Notes (Signed)
*  PRELIMINARY RESULTS* Echocardiogram 2D Echocardiogram with definity has been performed.  Troy Rogers, Troy Rogers 01/21/2014, 4:34 PM

## 2014-01-22 LAB — BASIC METABOLIC PANEL
ANION GAP: 15 (ref 5–15)
BUN: 16 mg/dL (ref 6–23)
CALCIUM: 8.5 mg/dL (ref 8.4–10.5)
CO2: 24 mEq/L (ref 19–32)
Chloride: 101 mEq/L (ref 96–112)
Creatinine, Ser: 1.07 mg/dL (ref 0.50–1.35)
GFR, EST NON AFRICAN AMERICAN: 86 mL/min — AB (ref >90)
GLUCOSE: 127 mg/dL — AB (ref 70–99)
POTASSIUM: 4 meq/L (ref 3.7–5.3)
SODIUM: 140 meq/L (ref 137–147)

## 2014-01-22 LAB — CBC
HCT: 37.3 % — ABNORMAL LOW (ref 39.0–52.0)
Hemoglobin: 12.6 g/dL — ABNORMAL LOW (ref 13.0–17.0)
MCH: 30.4 pg (ref 26.0–34.0)
MCHC: 33.8 g/dL (ref 30.0–36.0)
MCV: 89.9 fL (ref 78.0–100.0)
PLATELETS: 252 10*3/uL (ref 150–400)
RBC: 4.15 MIL/uL — AB (ref 4.22–5.81)
RDW: 13.6 % (ref 11.5–15.5)
WBC: 15.7 10*3/uL — ABNORMAL HIGH (ref 4.0–10.5)

## 2014-01-22 LAB — LIPID PANEL
Cholesterol: 144 mg/dL (ref 0–200)
HDL: 45 mg/dL (ref >39)
LDL Cholesterol: 87 mg/dL (ref 0–99)
TRIGLYCERIDES: 58 mg/dL (ref <150)
Total CHOL/HDL Ratio: 3.2 RATIO
VLDL: 12 mg/dL (ref 0–40)

## 2014-01-22 MED ORDER — OXYCODONE HCL 5 MG PO TABS: 5.0000 mg | ORAL_TABLET | ORAL | Status: DC | PRN

## 2014-01-22 MED ORDER — CARVEDILOL 25 MG PO TABS
25.0000 mg | ORAL_TABLET | Freq: Two times a day (BID) | ORAL | Status: DC
  Administered 2014-01-22: 25 mg via ORAL
  Filled 2014-01-22 (×2): qty 1

## 2014-01-22 MED ORDER — ATORVASTATIN CALCIUM 40 MG PO TABS: 40.0000 mg | ORAL_TABLET | Freq: Every day | ORAL | Status: DC

## 2014-01-22 MED ORDER — GUAIFENESIN-DM 100-10 MG/5ML PO SYRP: 5.0000 mL | ORAL_SOLUTION | ORAL | Status: DC | PRN

## 2014-01-22 MED ORDER — CLONIDINE HCL 0.2 MG/24HR TD PTWK: 0.2000 mg | MEDICATED_PATCH | TRANSDERMAL | Status: DC

## 2014-01-22 MED ORDER — IBUPROFEN 400 MG PO TABS
400.0000 mg | ORAL_TABLET | Freq: Four times a day (QID) | ORAL | Status: DC | PRN
  Filled 2014-01-22: qty 1

## 2014-01-22 MED ORDER — CLONIDINE HCL 0.2 MG/24HR TD PTWK
0.2000 mg | MEDICATED_PATCH | TRANSDERMAL | Status: DC
  Administered 2014-01-22 (×2): 0.2 mg via TRANSDERMAL
  Filled 2014-01-22: qty 1

## 2014-01-22 MED ORDER — LEVOFLOXACIN 500 MG PO TABS: 500.0000 mg | ORAL_TABLET | Freq: Every day | ORAL | Status: DC

## 2014-01-22 MED ORDER — LISINOPRIL 40 MG PO TABS
40.0000 mg | ORAL_TABLET | Freq: Every day | ORAL | Status: DC
  Administered 2014-01-22: 40 mg via ORAL
  Filled 2014-01-22: qty 1

## 2014-01-22 MED ORDER — ENOXAPARIN SODIUM 80 MG/0.8ML ~~LOC~~ SOLN
80.0000 mg | SUBCUTANEOUS | Status: DC
  Filled 2014-01-22: qty 0.8

## 2014-01-22 MED ORDER — LISINOPRIL 40 MG PO TABS: 40.0000 mg | ORAL_TABLET | Freq: Every day | ORAL | Status: DC

## 2014-01-22 MED ORDER — LEVOFLOXACIN 500 MG PO TABS
500.0000 mg | ORAL_TABLET | Freq: Every day | ORAL | Status: DC
  Administered 2014-01-22: 500 mg via ORAL
  Filled 2014-01-22: qty 1

## 2014-01-22 MED ORDER — KETOROLAC TROMETHAMINE 30 MG/ML IJ SOLN
30.0000 mg | Freq: Once | INTRAMUSCULAR | Status: AC
  Administered 2014-01-22: 30 mg via INTRAVENOUS
  Filled 2014-01-22: qty 1

## 2014-01-22 MED ORDER — ASPIRIN 325 MG PO TBEC: 325.0000 mg | DELAYED_RELEASE_TABLET | Freq: Every day | ORAL | Status: DC

## 2014-01-22 NOTE — Progress Notes (Signed)
Patient ambulated in hallway on room air and did not experience a drop in the oxygen saturation.  While walking up and down the hallway twice, the patient's O2 saturation varied between 97%-100% RA.  However, his respiration rate increased to 30 during walk. No obvious or visible signs of distress.  Patient tolerated ambulation well. Will continue to monitor.

## 2014-01-22 NOTE — Discharge Summary (Signed)
Physician Discharge Summary  Troy Rogers RUE:[id number] DOB: 1975-08-16 DOA: 01/21/2014  PCP: Treasa School, PA-C  Admit date: 01/21/2014 Discharge date: 01/22/2014  Time spent: 30 minutes  Recommendations for Outpatient Follow-up:  1. Follow up with Troy Rogers within one week of discharge 2. Please note several changes to pre admission medications 3. You may return to work 01/28/2014 AFTER you have been seen by Troy Rogers  Discharge Diagnoses:  Active Problems:  Pleuritic chest pain  Community acquired pneumonia  Hemoptysis  HTN  Dyslipidemia  Morbid Obesity  Suspected OSA    Discharge Condition: stable  Diet recommendation: Heart healthy  Filed Weights   01/21/14 1000  Weight: 364 lb 4.8 oz (165.245 kg)    History of present illness:  38 y.o. WM morbidly obese PMHx of Hypertension and IBS who presented today with complaint of chest pain and hemoptysis. He reported that he was sleeping the previous night and suddenly could not breath. Described a feeling "like there was a car sitting on my chest." He reported that he abruptly sat up and started to cough up blood. Describes it as "chunks" of blood that later became pinkish phlegm. Stated pain was right in the middle of his chest and did not radiate. Patient became diaphoretic and nauseated, but did not vomit. Denied previous history of similar episodes. No recent change in activity or diet. Rated the pain after Nitroglycerin administration to be about 6/10. No hx of obstructive apnea. Previously negative sleep study. Denied any history of fever, does have a dry cough.   Hospital Course:   Pleuritic chest pain -Cardiology consulted -CT angio was negative for PE -work up and exam not consistent with ischemic etiology (enzymes and EKG negative) -ECHO could not exclude RWMA but did show preserved LV function -CP did not resolve despite IV NTG and treatment for ischemia and was more c/w pleuritic etiology therefore  treatment focus shifted to pain management with narcotics and NSAIDs   Community acquired pneumonia/Hemoptysis -no further hemoptysis since admission -CXR was negative at admit and patient was not hypoxic even with ambulation -CT angio chest could not exclude pneumonitis -Empirically started on Levaquin and will continue for total 10 days -continue supportive care with mucolytics after discharge -out of work x 1 week  Diastolic CHF -See HTN   HTN -moderate control- patient reported not well controlled at home -review of pre admit meds show patient on both ARB/HCTZ and ACE I /HCTZ combination -we opted to dc all thiazide diuretic and instead increased the Lisinopril to 40 mg daily -added Catapres patch -continued Coreg -if has issues with intermittent LE edema consider prn diuretic -Follow up closely with PCP   Dyslipidemia -due to presentation concerning for cardiac ischemia a statin was initiated -Lipids looked great but given risk factors for CAD we opted to continue the statin with full dose ASA upon dc   Morbid Obesity -weight reduction discussed   Suspected OSA -has had prior normal PSG- suggested may need to pursue again  Procedures: 2D Echocardiogram - Left ventricle:  Mild concentric hypertrophy. LVEF=  55% to 60%. -(grade 1 diastolic dysfunction).- Left atrium: moderately dilated.   Consultations: Dr. Verne Carrow (Cardiology)     Discharge Exam: Filed Vitals:   01/22/14 1220  BP: 140/77  Pulse: 80  Temp: 98 F (36.7 C)  Resp: 20   Gen: No acute respiratory distress Chest: Clear to auscultation bilaterally without wheezes, rhonchi or crackles, room air Cardiac: Regular rate  and rhythm, S1-S2, no rubs murmurs or gallops, no peripheral edema, no JVD Abdomen: Soft nontender nondistended without obvious hepatosplenomegaly, no ascites Extremities: Symmetrical in appearance without cyanosis, clubbing or effusion    Discharge Instructions You  were cared for by a hospitalist during your hospital stay. If you have any questions about your discharge medications or the care you received while you were in the hospital after you are discharged, you can call the unit and asked to speak with the hospitalist on call if the hospitalist that took care of you is not available. Once you are discharged, your primary care physician will handle any further medical issues. Please note that NO REFILLS for any discharge medications will be authorized once you are discharged, as it is imperative that you return to your primary care physician (or establish a relationship with a primary care physician if you do not have one) for your aftercare needs so that they can reassess your need for medications and monitor your lab values.     Medication List    STOP taking these medications       lisinopril-hydrochlorothiazide 20-12.5 MG per tablet  Commonly known as:  PRINZIDE,ZESTORETIC     losartan-hydrochlorothiazide 100-25 MG per tablet  Commonly known as:  HYZAAR      TAKE these medications       aspirin 325 MG EC tablet  Take 1 tablet (325 mg total) by mouth daily.     atorvastatin 40 MG tablet  Commonly known as:  LIPITOR  Take 1 tablet (40 mg total) by mouth daily at 6 PM.     carvedilol 25 MG tablet  Commonly known as:  COREG  Take 25 mg by mouth 2 (two) times daily with a meal.     cloNIDine 0.2 mg/24hr patch  Commonly known as:  CATAPRES - Dosed in mg/24 hr  Place 1 patch (0.2 mg total) onto the skin once a week.     guaiFENesin-dextromethorphan 100-10 MG/5ML syrup  Commonly known as:  ROBITUSSIN DM  Take 5 mLs by mouth every 4 (four) hours as needed for cough.     ibuprofen 200 MG tablet  Commonly known as:  ADVIL,MOTRIN  Take 400 mg by mouth every 6 (six) hours as needed for moderate pain.     levofloxacin 500 MG tablet  Commonly known as:  LEVAQUIN  Take 1 tablet (500 mg total) by mouth daily.     lisinopril 40 MG tablet    Commonly known as:  PRINIVIL,ZESTRIL  Take 1 tablet (40 mg total) by mouth daily.     oxyCODONE 5 MG immediate release tablet  Commonly known as:  Oxy IR/ROXICODONE  Take 1 tablet (5 mg total) by mouth every 4 (four) hours as needed for moderate pain.       Allergies  Allergen Reactions  . Penicillins Hives       Follow-up Information   Schedule an appointment as soon as possible for a visit with Troy Rogers, PHILLIP, PA-C.   Specialty:  Physician Assistant   Contact information:   [address] [place] [address] [phone]        The results of significant diagnostics from this hospitalization (including imaging, microbiology, ancillary and laboratory) are listed below for reference.    Significant Diagnostic Studies: Dg Chest 2 View  01/21/2014   CLINICAL DATA:  Shortness of breath and chest pain.  Hemoptysis.  EXAM: CHEST  2 VIEW  COMPARISON:  11/24/2013  FINDINGS: The heart  size and mediastinal contours are within normal limits. Both lungs are clear. The visualized skeletal structures are unremarkable.  IMPRESSION: Normal exam.   Electronically Signed   By: Geanie CooleyJim  Maxwell M.D.   On: 01/21/2014 02:15   Ct Angio Chest Pe W/cm &/or Wo Cm  01/21/2014   CLINICAL DATA:  Shortness of breath and chest pain.  EXAM: CT ANGIOGRAPHY CHEST WITH CONTRAST  TECHNIQUE: Multidetector CT imaging of the chest was performed using the standard protocol during bolus administration of intravenous contrast. Multiplanar CT image reconstructions and MIPs were obtained to evaluate the vascular anatomy.  CONTRAST:  80mL OMNIPAQUE IOHEXOL 350 MG/ML SOLN  COMPARISON:  Chest x-ray 01/21/2014.  FINDINGS: Thoracic aorta is unremarkable. Great vessels patent. Pulmonary arteries are normal. No pulmonary embolus. Cardiomegaly. No pericardial effusion .  No significant mediastinal or hilar adenopathy. Thoracic esophagus is normal.  Large airways are patent. Minimal interstitial prominence is noted  throughout the lungs. These findings suggest process such as interstitial edema from congestive heart failure or pneumonitis.  Visualized upper abdominal structures are unremarkable.  Visualized thyroid unremarkable. No supraclavicular or axillary adenopathy. Degenerative changes thoracic spine. No acute bony abnormality.  Review of the MIP images confirms the above findings.  IMPRESSION: 1. Cardiomegaly with mild interstitial prominence suggesting congestive heart failure. Pneumonitis cannot be excluded. 2. No evidence of pulmonary embolus.   Electronically Signed   By: Maisie Fushomas  Register   On: 01/21/2014 07:12    Microbiology: Recent Results (from the past 240 hour(s))  MRSA PCR SCREENING     Status: None   Collection Time    01/21/14 10:00 AM      Result Value Ref Range Status   MRSA by PCR NEGATIVE  NEGATIVE Final   Comment:            The GeneXpert MRSA Assay (FDA     approved for NASAL specimens     only), is one component of a     comprehensive MRSA colonization     surveillance program. It is not     intended to diagnose MRSA     infection nor to guide or     monitor treatment for     MRSA infections.     Labs: Basic Metabolic Panel:  Recent Labs Lab 01/21/14 0137 01/21/14 1430 01/22/14 0409  NA 140  --  140  K 3.5*  --  4.0  CL 104  --  101  CO2  --   --  24  GLUCOSE 144*  --  127*  BUN 13  --  16  CREATININE 1.60* 1.03 1.07  CALCIUM  --   --  8.5   Liver Function Tests: No results found for this basename: AST, ALT, ALKPHOS, BILITOT, PROT, ALBUMIN,  in the last 168 hours No results found for this basename: LIPASE, AMYLASE,  in the last 168 hours No results found for this basename: AMMONIA,  in the last 168 hours CBC:  Recent Labs Lab 01/21/14 0128 01/21/14 0137 01/21/14 1430 01/22/14 0409  WBC 11.3*  --  10.9* 15.7*  NEUTROABS 8.0*  --   --   --   HGB 13.6 15.0 14.0 12.6*  HCT 40.7 44.0 41.7 37.3*  MCV 89.8  --  90.1 89.9  PLT 266  --  246 252    Cardiac Enzymes:  Recent Labs Lab 01/21/14 1051 01/21/14 1358 01/21/14 2015  TROPONINI <0.30 <0.30 <0.30   BNP: BNP (last 3 results)  Recent Labs  01/21/14 0450  PROBNP 182.5*   CBG: No results found for this basename: GLUCAP,  in the last 168 hours     Signed:  ELLIS,ALLISON L.  [facility] 01/22/2014, 12:22 PM  Examined patient with ANP Revonda Standard, agree with assessment and plan. Spoke with patient and family and should all questions. Patient with multiple complex medical problems direct patient care > 40 min

## 2014-01-22 NOTE — Progress Notes (Signed)
Subjective: Complains of 6/10 chest pressure, which is worse with cough, and 10/10 HA  Objective: Vital signs in last 24 hours: Temp:  [97.6 F (36.4 C)-98.1 F (36.7 C)] 97.6 F (36.4 C) (07/31 0806) Pulse Rate:  [67-99] 80 (07/31 0806) Resp:  [12-22] 19 (07/31 0806) BP: (133-174)/(72-99) 138/87 mmHg (07/31 0806) SpO2:  [93 %-100 %] 97 % (07/31 0806) Weight:  [364 lb 4.8 oz (165.245 kg)] 364 lb 4.8 oz (165.245 kg) (07/30 1000) Last BM Date: 01/21/14  Intake/Output from previous day: 07/30 0701 - 07/31 0700 In: 578.9 [P.O.:360; I.V.:118.9; IV Piggyback:100] Out: -  Intake/Output this shift: Total I/O In: -  Out: 400 [Urine:400]  Medications Current Facility-Administered Medications  Medication Dose Route Frequency Provider Last Rate Last Dose  . 0.9 %  sodium chloride infusion   Intravenous Continuous Shanker Levora Dredge, MD      . acetaminophen (TYLENOL) tablet 650 mg  650 mg Oral Q6H PRN Shanker Levora Dredge, MD       Or  . acetaminophen (TYLENOL) suppository 650 mg  650 mg Rectal Q6H PRN Shanker Levora Dredge, MD      . albuterol (PROVENTIL) (2.5 MG/3ML) 0.083% nebulizer solution 2.5 mg  2.5 mg Nebulization Q2H PRN Maretta Bees, MD      . aspirin EC tablet 325 mg  325 mg Oral Daily Maretta Bees, MD   325 mg at 01/21/14 1500  . atorvastatin (LIPITOR) tablet 40 mg  40 mg Oral q1800 Maretta Bees, MD   40 mg at 01/21/14 1716  . carvedilol (COREG) tablet 25 mg  25 mg Oral BID WC Russella Dar, NP      . enoxaparin (LOVENOX) injection 40 mg  40 mg Subcutaneous Q24H Maretta Bees, MD   40 mg at 01/21/14 2148  . guaiFENesin-dextromethorphan (ROBITUSSIN DM) 100-10 MG/5ML syrup 5 mL  5 mL Oral Q4H PRN Maretta Bees, MD      . ibuprofen (ADVIL,MOTRIN) tablet 400 mg  400 mg Oral Q6H PRN Russella Dar, NP      . ketorolac (TORADOL) 30 MG/ML injection 30 mg  30 mg Intravenous Once Russella Dar, NP      . levofloxacin [facility]) tablet 500 mg  500 mg Oral Daily  Russella Dar, NP      . lisinopril (PRINIVIL,ZESTRIL) tablet 40 mg  40 mg Oral Daily Russella Dar, NP      . morphine 2 MG/ML injection 2 mg  2 mg Intravenous Q4H PRN Joline Salt Barrett, Rogers   2 mg at 01/22/14 0631  . ondansetron (ZOFRAN) tablet 4 mg  4 mg Oral Q6H PRN Shanker Levora Dredge, MD       Or  . ondansetron (ZOFRAN) injection 4 mg  4 mg Intravenous Q6H PRN Shanker Levora Dredge, MD      . oxyCODONE (Oxy IR/ROXICODONE) immediate release tablet 5 mg  5 mg Oral Q4H PRN Shanker Levora Dredge, MD      . pantoprazole (PROTONIX) EC tablet 40 mg  40 mg Oral Q1200 Maretta Bees, MD   40 mg at 01/21/14 1500  . sodium chloride 0.9 % injection 3 mL  3 mL Intravenous Q12H Maretta Bees, MD   3 mL at 01/21/14 2151    PE: General appearance: alert, cooperative and no distress Lungs: clear to auscultation bilaterally Heart: regular rate and rhythm, S1, S2 normal, no murmur, click, rub or gallop Abdomen: BS. nontender Extremities: No LEE Pulses:  2+ and symmetric Skin: Warm and dry Neurologic: Grossly normal  Lab Results:   Recent Labs  01/21/14 0128 01/21/14 0137 01/21/14 1430 01/22/14 0409  WBC 11.3*  --  10.9* 15.7*  HGB 13.6 15.0 14.0 12.6*  HCT 40.7 44.0 41.7 37.3*  PLT 266  --  246 252   BMET  Recent Labs  01/21/14 0137 01/21/14 1430 01/22/14 0409  NA 140  --  140  K 3.5*  --  4.0  CL 104  --  101  CO2  --   --  24  GLUCOSE 144*  --  127*  BUN 13  --  16  CREATININE 1.60* 1.03 1.07  CALCIUM  --   --  8.5   PT/INR No results found for this basename: LABPROT, INR,  in the last 72 hours Cholesterol  Recent Labs  01/22/14 0409  CHOL 144   Lipid Panel     Component Value Date/Time   CHOL 144 01/22/2014 0409   TRIG 58 01/22/2014 0409   HDL 45 01/22/2014 0409   CHOLHDL 3.2 01/22/2014 0409   VLDL 12 01/22/2014 0409   LDLCALC 87 01/22/2014 0409   Cardiac Panel (last 3 results)  Recent Labs  01/21/14 1051 01/21/14 1358 01/21/14 2015  TROPONINI <0.30 <0.30  <0.30    Assessment/Plan 38 yo morbidly obese male with a history of HTN.  Presented with 10/10 CP  And coughed up bright red blood several times.    1. Chest pain, atypical Ruled out for MI.  No ischemic EKG change's.  Worse with cough.  Echo cardiogram reveals EF of 55-60%, LA mod dilation, grade one diastolic dysfunction.  Likely noncardiac CP.  WBCs are increasing.  Negative PE on CT angio.  Possible pneumonitis.  Normal CXR yesterday. No family history of CAD(mom and dad in the room).  I do not think further cardiac WU is required.   2. Morbid obesity.   Needs drastic lifestyle modification.  We talked briefly about exercise. 3. Hyperglycemia (not known to be diabetic).   A1C 6.1 4. Essential hypertension.   BP better this morning.  Coreg 25 bid, lisinopril 40. 5. Possible mild CHF. Pro-BNP only borderline elevated 182.  6. Mild renal insufficiency. 7. Atypical pneumonia  On levaquin.    LOS: 1 day    Troy Rogers, Troy Rogers 01/22/2014 9:52 AM  I have personally seen and examined this patient with Wilburt FinlayBryan Hager, Rogers. I agree with the assessment and plan as outlined above. Chest pain is atypical. No objective evidence of ischemia. LV function is normal with no wall motion abnormalities. This does not sound like cardiac chest pain. No further cardiac workup. Will sign off. Please call with questions.   Cerissa Zeiger 01/22/2014 10:41 AM

## 2014-01-22 NOTE — Discharge Instructions (Signed)
Hemoptysis Hemoptysis, which means coughing up blood, can be a sign of a minor problem or a serious medical condition. The blood that is coughed up may come from the lungs and airways. Coughed-up blood can also come from bleeding that occurs outside the lungs and airways. Blood can drain into the windpipe during a severe nosebleed or when blood is vomited from the stomach. Because hemoptysis can be a sign of something serious, a medical evaluation is required. For some people with hemoptysis, no definite cause is ever identified. CAUSES  The most common cause of hemoptysis is bronchitis. Some other common causes include:   A ruptured blood vessel caused by coughing or an infection.   A medical condition that causes damage to the large air passageways (bronchiectasis).   A blood clot in the lungs (pulmonary embolism).   Pneumonia.   Tuberculosis.   Breathing in a small foreign object.   Cancer. For some people with hemoptysis, no definite cause is ever identified.  HOME CARE INSTRUCTIONS  Only take over-the-counter or prescription medicines as directed by your caregiver. Do not use cough suppressants unless your caregiver approves.  If your caregiver prescribes antibiotic medicines, take them as directed. Finish them even if you start to feel better.  Do not smoke. Also avoid secondhand smoke.  Follow up with your caregiver as directed. SEEK IMMEDIATE MEDICAL CARE IF:   You cough up bloody mucus for longer than a week.  You have a blood-producing cough that is severe or getting worse.  You have a blood-producing cough thatcomes and goes over time.  You develop problems with your breathing.   You vomit blood.  You develop bloody or black-colored stools.  You have chest pain.   You develop night sweats.  You feel faint or pass out.   You have a fever or persistent symptoms for more than 2-3 days.  You have a fever and your symptoms suddenly get worse. MAKE  SURE YOU:  Understand these instructions.  Will watch your condition.  Will get help right away if you are not doing well or get worse. Document Released: 08/20/2001 Document Revised: 05/28/2012 Document Reviewed: 03/28/2012 [facility] Patient Information 2015 [employer], [place]. This information is not intended to replace advice given to you by your health care provider. Make sure you discuss any questions you have with your health care provider. Ibuprofen tablets and capsules What is this medicine? IBUPROFEN (eye BYOO proe fen) is a non-steroidal anti-inflammatory drug (NSAID). It is used for dental pain, fever, headaches or migraines, osteoarthritis, rheumatoid arthritis, or painful monthly periods. It can also relieve minor aches and pains caused by a cold, flu, or sore throat. This medicine may be used for other purposes; ask your health care provider or pharmacist if you have questions. COMMON BRAND NAME(S): Advil, Advil Junior Strength, Advil Migraine, Genpril, Ibren, IBU, Midol, Midol Cramps and Body Aches, Motrin, Motrin IB, Motrin Junior Strength, Motrin Migraine Pain, Samson-8, Toxicology Saliva Collection What should I tell my health care provider before I take this medicine? They need to know if you have any of these conditions: -asthma -cigarette smoker -drink more than 3 alcohol containing drinks a day -heart disease or circulation problems such as heart failure or leg edema (fluid retention) -high blood pressure -kidney disease -liver disease -stomach bleeding or ulcers -an unusual or allergic reaction to ibuprofen, aspirin, other NSAIDS, other medicines, foods, dyes, or preservatives -pregnant or trying to get pregnant -breast-feeding How should I use this medicine? Take this medicine by  mouth with a glass of water. Follow the directions on the prescription label. Take this medicine with food if your stomach gets upset. Try to not lie down for at least 10 minutes after you  take the medicine. Take your medicine at regular intervals. Do not take your medicine more often than directed. A special MedGuide will be given to you by the pharmacist with each prescription and refill. Be sure to read this information carefully each time. Talk to your pediatrician regarding the use of this medicine in children. Special care may be needed. Overdosage: If you think you have taken too much of this medicine contact a poison control center or emergency room at once. NOTE: This medicine is only for you. Do not share this medicine with others. What if I miss a dose? If you miss a dose, take it as soon as you can. If it is almost time for your next dose, take only that dose. Do not take double or extra doses. What may interact with this medicine? Do not take this medicine with any of the following medications: -cidofovir -ketorolac -methotrexate -pemetrexed This medicine may also interact with the following medications: -alcohol -aspirin -diuretics -lithium -other drugs for inflammation like prednisone -warfarin This list may not describe all possible interactions. Give your health care provider a list of all the medicines, herbs, non-prescription drugs, or dietary supplements you use. Also tell them if you smoke, drink alcohol, or use illegal drugs. Some items may interact with your medicine. What should I watch for while using this medicine? Tell your doctor or healthcare professional if your symptoms do not start to get better or if they get worse. This medicine does not prevent heart attack or stroke. In fact, this medicine may increase the chance of a heart attack or stroke. The chance may increase with longer use of this medicine and in people who have heart disease. If you take aspirin to prevent heart attack or stroke, talk with your doctor or health care professional. Do not take other medicines that contain aspirin, ibuprofen, or naproxen with this medicine. Side effects  such as stomach upset, nausea, or ulcers may be more likely to occur. Many medicines available without a prescription should not be taken with this medicine. This medicine can cause ulcers and bleeding in the stomach and intestines at any time during treatment. Ulcers and bleeding can happen without warning symptoms and can cause death. To reduce your risk, do not smoke cigarettes or drink alcohol while you are taking this medicine. You may get drowsy or dizzy. Do not drive, use machinery, or do anything that needs mental alertness until you know how this medicine affects you. Do not stand or sit up quickly, especially if you are an older patient. This reduces the risk of dizzy or fainting spells. This medicine can cause you to bleed more easily. Try to avoid damage to your teeth and gums when you brush or floss your teeth. This medicine may be used to treat migraines. If you take migraine medicines for 10 or more days a month, your migraines may get worse. Keep a diary of headache days and medicine use. Contact your healthcare professional if your migraine attacks occur more frequently. What side effects may I notice from receiving this medicine? Side effects that you should report to your doctor or health care professional as soon as possible: -allergic reactions like skin rash, itching or hives, swelling of the face, lips, or tongue -severe stomach pain -signs and symptoms  of bleeding such as bloody or black, tarry stools; red or dark-brown urine; spitting up blood or brown material that looks like coffee grounds; red spots on the skin; unusual bruising or bleeding from the eye, gums, or nose -signs and symptoms of a blood clot such as changes in vision; chest pain; severe, sudden headache; trouble speaking; sudden numbness or weakness of the face, arm, or leg -unexplained weight gain or swelling -unusually weak or tired -yellowing of eyes or skin Side effects that usually do not require medical  attention (report to your doctor or health care professional if they continue or are bothersome): -bruising -diarrhea -dizziness, drowsiness -headache -nausea, vomiting This list may not describe all possible side effects. Call your doctor for medical advice about side effects. You may report side effects to FDA at 1-800-FDA-1088. Where should I keep my medicine? Keep out of the reach of children. Store at room temperature between 15 and 30 degrees C (59 and 86 degrees F). Keep container tightly closed. Throw away any unused medicine after the expiration date. NOTE: This sheet is a summary. It may not cover all possible information. If you have questions about this medicine, talk to your doctor, pharmacist, or health care provider.  2015, [employer]. (2013-02-10 10:48:02) Oxycodone tablets or capsules What is this medicine? OXYCODONE (ox i KOE done) is a pain reliever. It is used to treat moderate to severe pain. This medicine may be used for other purposes; ask your health care provider or pharmacist if you have questions. COMMON BRAND NAME(S): Dazidox, Endocodone, OXECTA, OxyIR, Percolone, Roxicodone What should I tell my health care provider before I take this medicine? They need to know if you have any of these conditions: -Addison's disease -brain tumor -drug abuse or addiction -head injury -heart disease -if you frequently drink alcohol containing drinks -kidney disease or problems going to the bathroom -liver disease -lung disease, asthma, or breathing problems -mental problems -an unusual or allergic reaction to oxycodone, codeine, hydrocodone, morphine, other medicines, foods, dyes, or preservatives -pregnant or trying to get pregnant -breast-feeding How should I use this medicine? Take this medicine by mouth with a glass of water. Follow the directions on the prescription label. You can take it with or without food. If it upsets your stomach, take it with food.  Take your medicine at regular intervals. Do not take it more often than directed. Do not stop taking except on your doctor's advice. Some brands of this medicine, like Oxecta, have special instructions. Ask your doctor or pharmacist if these directions are for you: Do not cut, crush or chew this medicine. Swallow only one tablet at a time. Do not wet, soak, or lick the tablet before you take it. Talk to your pediatrician regarding the use of this medicine in children. Special care may be needed. Overdosage: If you think you have taken too much of this medicine contact a poison control center or emergency room at once. NOTE: This medicine is only for you. Do not share this medicine with others. What if I miss a dose? If you miss a dose, take it as soon as you can. If it is almost time for your next dose, take only that dose. Do not take double or extra doses. What may interact with this medicine? -alcohol -antihistamines -certain medicines used for nausea like chlorpromazine, droperidol -erythromycin -ketoconazole -medicines for depression, anxiety, or psychotic disturbances -medicines for sleep -muscle relaxants -naloxone -naltrexone -narcotic medicines (opiates) for pain -nilotinib -phenobarbital -phenytoin -rifampin -ritonavir -voriconazole  This list may not describe all possible interactions. Give your health care provider a list of all the medicines, herbs, non-prescription drugs, or dietary supplements you use. Also tell them if you smoke, drink alcohol, or use illegal drugs. Some items may interact with your medicine. What should I watch for while using this medicine? Tell your doctor or health care professional if your pain does not go away, if it gets worse, or if you have new or a different type of pain. You may develop tolerance to the medicine. Tolerance means that you will need a higher dose of the medicine for pain relief. Tolerance is normal and is expected if you take this  medicine for a long time. Do not suddenly stop taking your medicine because you may develop a severe reaction. Your body becomes used to the medicine. This does NOT mean you are addicted. Addiction is a behavior related to getting and using a drug for a non-medical reason. If you have pain, you have a medical reason to take pain medicine. Your doctor will tell you how much medicine to take. If your doctor wants you to stop the medicine, the dose will be slowly lowered over time to avoid any side effects. You may get drowsy or dizzy when you first start taking this medicine or change doses. Do not drive, use machinery, or do anything that may be dangerous until you know how the medicine affects you. Stand or sit up slowly. There are different types of narcotic medicines (opiates) for pain. If you take more than one type at the same time, you may have more side effects. Give your health care provider a list of all medicines you use. Your doctor will tell you how much medicine to take. Do not take more medicine than directed. Call emergency for help if you have problems breathing. This medicine will cause constipation. Try to have a bowel movement at least every 2 to 3 days. If you do not have a bowel movement for 3 days, call your doctor or health care professional. Your mouth may get dry. Drinking water, chewing sugarless gum, or sucking on hard candy may help. See your dentist every 6 months. What side effects may I notice from receiving this medicine? Side effects that you should report to your doctor or health care professional as soon as possible: -allergic reactions like skin rash, itching or hives, swelling of the face, lips, or tongue -breathing problems -confusion -feeling faint or lightheaded, falls -trouble passing urine or change in the amount of urine -unusually weak or tired Side effects that usually do not require medical attention (report to your doctor or health care professional if they  continue or are bothersome): -constipation -dry mouth -itching -nausea, vomiting -upset stomach This list may not describe all possible side effects. Call your doctor for medical advice about side effects. You may report side effects to FDA at 1-800-FDA-1088. Where should I keep my medicine? Keep out of the reach of children. This medicine can be abused. Keep your medicine in a safe place to protect it from theft. Do not share this medicine with anyone. Selling or giving away this medicine is dangerous and against the law. Store at room temperature between 15 and 30 degrees C (59 and 86 degrees F). Protect from light. Keep container tightly closed. This medicine may cause accidental overdose and death if it is taken by other adults, children, or pets. Flush any unused medicine down the toilet to reduce the chance of harm.  Do not use the medicine after the expiration date. NOTE: This sheet is a summary. It may not cover all possible information. If you have questions about this medicine, talk to your doctor, pharmacist, or health care provider.  2015, [employer] Standard. (2013-02-19 13:43:33) Carvedilol tablets What is this medicine? CARVEDILOL (KAR ve dil ol) is a beta-blocker. Beta-blockers reduce the workload on the heart and help it to beat more regularly. This medicine is used to treat high blood pressure and heart failure. This medicine may be used for other purposes; ask your health care provider or pharmacist if you have questions. COMMON BRAND NAME(S): Coreg What should I tell my health care provider before I take this medicine? They need to know if you have any of these conditions: -circulation problems -diabetes -history of heart attack or heart disease -liver disease -lung or breathing disease, like asthma or emphysema -pheochromocytoma -slow or irregular heartbeat -thyroid disease -an unusual or allergic reaction to carvedilol, other beta-blockers, medicines, foods, dyes,  or preservatives -pregnant or trying to get pregnant -breast-feeding How should I use this medicine? Take this medicine by mouth with a glass of water. Follow the directions on the prescription label. It is best to take the tablets with food. Take your doses at regular intervals. Do not take your medicine more often than directed. Do not stop taking except on the advice of your doctor or health care professional. Talk to your pediatrician regarding the use of this medicine in children. Special care may be needed. Overdosage: If you think you have taken too much of this medicine contact a poison control center or emergency room at once. NOTE: This medicine is only for you. Do not share this medicine with others. What if I miss a dose? If you miss a dose, take it as soon as you can. If it is almost time for your next dose, take only that dose. Do not take double or extra doses. What may interact with this medicine? This medicine may interact with the following medications: -certain medicines for blood pressure, heart disease, irregular heart beat -certain medicines for depression, like fluoxetine or paroxetine -certain medicines for diabetes, like glipizide or glyburide -cimetidine -clonidine -cyclosporine -digoxin -MAOIs like Carbex, Eldepryl, Marplan, Nardil, and Parnate -reserpine -rifampin This list may not describe all possible interactions. Give your health care provider a list of all the medicines, herbs, non-prescription drugs, or dietary supplements you use. Also tell them if you smoke, drink alcohol, or use illegal drugs. Some items may interact with your medicine. What should I watch for while using this medicine? Check your heart rate and blood pressure regularly while you are taking this medicine. Ask your doctor or health care professional what your heart rate and blood pressure should be, and when you should contact him or her. Do not stop taking this medicine suddenly. This  could lead to serious heart-related effects. Contact your doctor or health care professional if you have difficulty breathing while taking this drug. Check your weight daily. Ask your doctor or health care professional when you should notify him/her of any weight gain. You may get drowsy or dizzy. Do not drive, use machinery, or do anything that requires mental alertness until you know how this medicine affects you. To reduce the risk of dizzy or fainting spells, do not sit or stand up quickly. Alcohol can make you more drowsy, and increase flushing and rapid heartbeats. Avoid alcoholic drinks. If you have diabetes, check your blood sugar as directed. Tell your  doctor if you have changes in your blood sugar while you are taking this medicine. If you are going to have surgery, tell your doctor or health care professional that you are taking this medicine. What side effects may I notice from receiving this medicine? Side effects that you should report to your doctor or health care professional as soon as possible: -allergic reactions like skin rash, itching or hives, swelling of the face, lips, or tongue -breathing problems -dark urine -irregular heartbeat -swollen legs or ankles -vomiting -yellowing of the eyes or skin Side effects that usually do not require medical attention (report to your doctor or health care professional if they continue or are bothersome): -change in sex drive or performance -diarrhea -dry eyes (especially if wearing contact lenses) -dry, itching skin -headache -nausea -unusually tired This list may not describe all possible side effects. Call your doctor for medical advice about side effects. You may report side effects to FDA at 1-800-FDA-1088. Where should I keep my medicine? Keep out of the reach of children. Store at room temperature below 30 degrees C (86 degrees F). Protect from moisture. Keep container tightly closed. Throw away any unused medicine after the  expiration date. NOTE: This sheet is a summary. It may not cover all possible information. If you have questions about this medicine, talk to your doctor, pharmacist, or health care provider.  2015, [employer] Standard. (2013-02-15 14:12:02) Clonidine skin patches What is this medicine? CLONIDINE (KLOE ni deen) is used to treat high blood pressure. This medicine may be used for other purposes; ask your health care provider or pharmacist if you have questions. COMMON BRAND NAME(S): Catapres-TTS What should I tell my health care provider before I take this medicine? They need to know if you have any of these conditions: -kidney disease -an unusual or allergic reaction to clonidine, other medicines, foods, dyes, or preservatives -pregnant or trying to get pregnant -breast-feeding How should I use this medicine? This medicine is for external use only. Follow the directions on the prescription label. Apply the patch to an area of the upper arm or part of the body that is clean, dry and hairless. Avoid injured, irritated, calloused, or scarred areas. Use a different site each time to prevent skin irritation. Do not cut or trim the patch. One patch should last for 7 days. Do not use your medicine more often than directed. Do not stop using except on the advice of your doctor or health care professional. You must gradually reduce the dose or you may get a dangerous increase in blood pressure. Talk to your pediatrician regarding the use of this medicine in children. Special care may be needed. Overdosage: If you think you have taken too much of this medicine contact a poison control center or emergency room at once. NOTE: This medicine is only for you. Do not share this medicine with others. What if I miss a dose? Replace each patch on the same day of each week, or if the patch falls off. If you do forget to change the patch for two or three days, check with your doctor or health care  professional. What may interact with this medicine? Do not take this medicine with any of the following medications: -MAOIs like Carbex, Eldepryl, Marplan, Nardil, and Parnate This medicine may also interact with the following medications: -barbiturate medicines for inducing sleep or treating seizures like phenobarbital -certain medicines for blood pressure, heart disease, irregular heart beat -certain medicines for depression, anxiety, or psychotic disturbances -prescription  pain medicines This list may not describe all possible interactions. Give your health care provider a list of all the medicines, herbs, non-prescription drugs, or dietary supplements you use. Also tell them if you smoke, drink alcohol, or use illegal drugs. Some items may interact with your medicine. What should I watch for while using this medicine? Visit your doctor or health care professional for regular checks on your progress. Check your heart rate and blood pressure regularly while you are using this medicine. Ask your doctor or health care professional what your heart rate should be and when you should contact him or her. You can shower or bathe with the skin patch in position. If the patch gets loose, cover it with the extra adhesive overlay provided. You may get drowsy or dizzy. Do not drive, use machinery, or do anything that needs mental alertness until you know how this medicine affects you. To avoid dizzy or fainting spells, do not stand or sit up quickly, especially if you are an older person. Alcohol can make you more drowsy and dizzy. Avoid alcoholic drinks. Your mouth may get dry. Chewing sugarless gum or sucking hard candy, and drinking plenty of water will help. Do not treat yourself for coughs, colds, or pain while you are using this medicine without asking your doctor or health care professional for advice. Some ingredients may increase your blood pressure. If you are going to have surgery tell your doctor  or health care professional that you are using this medicine. If you are going to have a magnetic resonance imaging (MRI) procedure, tell your MRI technician if you have this patch on your body. It must be removed before a MRI. What side effects may I notice from receiving this medicine? Side effects that you should report to your doctor or health care professional as soon as possible: -allergic reactions like skin rash, itching or hives, swelling of the face, lips, or tongue -anxiety, nervousness -chest pain -depression -fast, irregular heartbeat -swelling of feet or legs -unusually weak or tired Side effects that usually do not require medical attention (report to your doctor or health care professional if they continue or are bothersome): -change in sex drive or performance -constipation -headache -skin redness, irritation, or darkening under the patch area This list may not describe all possible side effects. Call your doctor for medical advice about side effects. You may report side effects to FDA at 1-800-FDA-1088. Where should I keep my medicine? Keep out of the reach of children. Store at room temperature below 30 degrees C (86 degrees F). Throw away any unused medicine after the expiration date. NOTE: This sheet is a summary. It may not cover all possible information. If you have questions about this medicine, talk to your doctor, pharmacist, or health care provider.  2015, Elsevier/Gold Standard. (2010-12-06 14:01:34) Lisinopril tablets What is this medicine? LISINOPRIL (lyse IN oh pril) is an ACE inhibitor. This medicine is used to treat high blood pressure and heart failure. It is also used to protect the heart immediately after a heart attack. This medicine may be used for other purposes; ask your health care provider or pharmacist if you have questions. COMMON BRAND NAME(S): Prinivil, Zestril What should I tell my health care provider before I take this medicine? They need  to know if you have any of these conditions: -diabetes -heart or blood vessel disease -immune system disease like lupus or scleroderma -kidney disease -low blood pressure -previous swelling of the tongue, face, or lips with difficulty  breathing, difficulty swallowing, hoarseness, or tightening of the throat -an unusual or allergic reaction to lisinopril, other ACE inhibitors, insect venom, foods, dyes, or preservatives -pregnant or trying to get pregnant -breast-feeding How should I use this medicine? Take this medicine by mouth with a glass of water. Follow the directions on your prescription label. You may take this medicine with or without food. Take your medicine at regular intervals. Do not stop taking this medicine except on the advice of your doctor or health care professional. Talk to your pediatrician regarding the use of this medicine in children. Special care may be needed. While this drug may be prescribed for children as young as 5 years of age for selected conditions, precautions do apply. Overdosage: If you think you have taken too much of this medicine contact a poison control center or emergency room at once. NOTE: This medicine is only for you. Do not share this medicine with others. What if I miss a dose? If you miss a dose, take it as soon as you can. If it is almost time for your next dose, take only that dose. Do not take double or extra doses. What may interact with this medicine? -diuretics -lithium -NSAIDs, medicines for pain and inflammation, like ibuprofen or naproxen -over-the-counter herbal supplements like hawthorn -potassium salts or potassium supplements -salt substitutes This list may not describe all possible interactions. Give your health care provider a list of all the medicines, herbs, non-prescription drugs, or dietary supplements you use. Also tell them if you smoke, drink alcohol, or use illegal drugs. Some items may interact with your medicine. What  should I watch for while using this medicine? Visit your doctor or health care professional for regular check ups. Check your blood pressure as directed. Ask your doctor what your blood pressure should be, and when you should contact him or her. Call your doctor or health care professional if you notice an irregular or fast heart beat. Women should inform their doctor if they wish to become pregnant or think they might be pregnant. There is a potential for serious side effects to an unborn child. Talk to your health care professional or pharmacist for more information. Check with your doctor or health care professional if you get an attack of severe diarrhea, nausea and vomiting, or if you sweat a lot. The loss of too much body fluid can make it dangerous for you to take this medicine. You may get drowsy or dizzy. Do not drive, use machinery, or do anything that needs mental alertness until you know how this drug affects you. Do not stand or sit up quickly, especially if you are an older patient. This reduces the risk of dizzy or fainting spells. Alcohol can make you more drowsy and dizzy. Avoid alcoholic drinks. Avoid salt substitutes unless you are told otherwise by your doctor or health care professional. Do not treat yourself for coughs, colds, or pain while you are taking this medicine without asking your doctor or health care professional for advice. Some ingredients may increase your blood pressure. What side effects may I notice from receiving this medicine? Side effects that you should report to your doctor or health care professional as soon as possible: -abdominal pain with or without nausea or vomiting -allergic reactions like skin rash or hives, swelling of the hands, feet, face, lips, throat, or tongue -dark urine -difficulty breathing -dizzy, lightheaded or fainting spell -fever or sore throat -irregular heart beat, chest pain -pain or difficulty passing urine -redness,  blistering,  peeling or loosening of the skin, including inside the mouth -unusually weak -yellowing of the eyes or skin Side effects that usually do not require medical attention (report to your doctor or health care professional if they continue or are bothersome): -change in taste -cough -decreased sexual function or desire -headache -sun sensitivity -tiredness This list may not describe all possible side effects. Call your doctor for medical advice about side effects. You may report side effects to FDA at 1-800-FDA-1088. Where should I keep my medicine? Keep out of the reach of children. Store at room temperature between 15 and 30 degrees C (59 and 86 degrees F). Protect from moisture. Keep container tightly closed. Throw away any unused medicine after the expiration date. NOTE: This sheet is a summary. It may not cover all possible information. If you have questions about this medicine, talk to your doctor, pharmacist, or health care provider.  2015, Elsevier/Gold Standard. (2007-12-15 17:36:32) Levofloxacin tablets What is this medicine? LEVOFLOXACIN (lee voe FLOX a sin) is a quinolone antibiotic. It is used to treat certain kinds of bacterial infections. It will not work for colds, flu, or other viral infections. This medicine may be used for other purposes; ask your health care provider or pharmacist if you have questions. COMMON BRAND NAME(S): Levaquin, Levaquin Leva-Pak What should I tell my health care provider before I take this medicine? They need to know if you have any of these conditions: -cerebral disease -irregular heartbeat -kidney disease -seizure disorder -an unusual or allergic reaction to levofloxacin, other antibiotics or medicines, foods, dyes, or preservatives -pregnant or trying to get pregnant -breast-feeding How should I use this medicine? Take this medicine by mouth with a full glass of water. Follow the directions on the prescription label. This medicine can be taken  with or without food. Take your medicine at regular intervals. Do not take your medicine more often than directed. Do not skip doses or stop your medicine early even if you feel better. Do not stop taking except on your doctor's advice. A special MedGuide will be given to you by the pharmacist with each prescription and refill. Be sure to read this information carefully each time. Talk to your pediatrician regarding the use of this medicine in children. While this drug may be prescribed for children as young as 6 months for selected conditions, precautions do apply. Overdosage: If you think you have taken too much of this medicine contact a poison control center or emergency room at once. NOTE: This medicine is only for you. Do not share this medicine with others. What if I miss a dose? If you miss a dose, take it as soon as you remember. If it is almost time for your next dose, take only that dose. Do not take double or extra doses. What may interact with this medicine? Do not take this medicine with any of the following medications: -arsenic trioxide -chloroquine -droperidol -medicines for irregular heart rhythm like amiodarone, disopyramide, dofetilide, flecainide, quinidine, procainamide, sotalol -some medicines for depression or mental problems like phenothiazines, pimozide, and ziprasidone This medicine may also interact with the following medications: -amoxapine -antacids -birth control pills -cisapride -dairy products -didanosine (ddI) buffered tablets or powder -haloperidol -multivitamins -NSAIDS, medicines for pain and inflammation, like ibuprofen or naproxen -retinoid products like tretinoin or isotretinoin -risperidone -some other antibiotics like clarithromycin or erythromycin -sucralfate -theophylline -warfarin This list may not describe all possible interactions. Give your health care provider a list of all the medicines, herbs, non-prescription drugs,  or dietary  supplements you use. Also tell them if you smoke, drink alcohol, or use illegal drugs. Some items may interact with your medicine. What should I watch for while using this medicine? Tell your doctor or health care professional if your symptoms do not improve or if they get worse. Drink several glasses of water a day and cut down on drinks that contain caffeine. You must not get dehydrated while taking this medicine. You may get drowsy or dizzy. Do not drive, use machinery, or do anything that needs mental alertness until you know how this medicine affects you. Do not sit or stand up quickly, especially if you are an older patient. This reduces the risk of dizzy or fainting spells. This medicine can make you more sensitive to the sun. Keep out of the sun. If you cannot avoid being in the sun, wear protective clothing and use a sunscreen. Do not use sun lamps or tanning beds/booths. Contact your doctor if you get a sunburn. If you are a diabetic monitor your blood glucose carefully. If you get an unusual reading stop taking this medicine and call your doctor right away. Do not treat diarrhea with over-the-counter products. Contact your doctor if you have diarrhea that lasts more than 2 days or if the diarrhea is severe and watery. Avoid antacids, calcium, iron, and zinc products for 2 hours before and 2 hours after taking a dose of this medicine. What side effects may I notice from receiving this medicine? Side effects that you should report to your doctor or health care professional as soon as possible: -allergic reactions like skin rash or hives, swelling of the face, lips, or tongue -changes in vision -confusion, nightmares or hallucinations -difficulty breathing -irregular heartbeat, chest pain -joint, muscle or tendon pain -pain or difficulty passing urine -persistent headache with or without blurred vision -redness, blistering, peeling or loosening of the skin, including inside the  mouth -seizures -unusual pain, numbness, tingling, or weakness -vaginal irritation, discharge Side effects that usually do not require medical attention (report to your doctor or health care professional if they continue or are bothersome): -diarrhea -dry mouth -headache -stomach upset, nausea -trouble sleeping This list may not describe all possible side effects. Call your doctor for medical advice about side effects. You may report side effects to FDA at 1-800-FDA-1088. Where should I keep my medicine? Keep out of the reach of children. Store at room temperature between 15 and 30 degrees C (59 and 86 degrees F). Keep in a tightly closed container. Throw away any unused medicine after the expiration date. NOTE: This sheet is a summary. It may not cover all possible information. If you have questions about this medicine, talk to your doctor, pharmacist, or health care provider.  2015, Elsevier/Gold Standard. (2013-01-16 07:45:07) Pneumonia Pneumonia is an infection of the lungs.  CAUSES Pneumonia may be caused by bacteria or a virus. Usually, these infections are caused by breathing infectious particles into the lungs (respiratory tract). SIGNS AND SYMPTOMS   Cough.  Fever.  Chest pain.  Increased rate of breathing.  Wheezing.  Mucus production. DIAGNOSIS  If you have the common symptoms of pneumonia, your health care provider will typically confirm the diagnosis with a chest X-ray. The X-ray will show an abnormality in the lung (pulmonary infiltrate) if you have pneumonia. Other tests of your blood, urine, or sputum may be done to find the specific cause of your pneumonia. Your health care provider may also do tests (blood gases or pulse  oximetry) to see how well your lungs are working. TREATMENT  Some forms of pneumonia may be spread to other people when you cough or sneeze. You may be asked to wear a mask before and during your exam. Pneumonia that is caused by bacteria is  treated with antibiotic medicine. Pneumonia that is caused by the influenza virus may be treated with an antiviral medicine. Most other viral infections must run their course. These infections will not respond to antibiotics.  HOME CARE INSTRUCTIONS   Cough suppressants may be used if you are losing too much rest. However, coughing protects you by clearing your lungs. You should avoid using cough suppressants if you can.  Your health care provider may have prescribed medicine if he or she thinks your pneumonia is caused by bacteria or influenza. Finish your medicine even if you start to feel better.  Your health care provider may also prescribe an expectorant. This loosens the mucus to be coughed up.  Take medicines only as directed by your health care provider.  Do not smoke. Smoking is a common cause of bronchitis and can contribute to pneumonia. If you are a smoker and continue to smoke, your cough may last several weeks after your pneumonia has cleared.  A cold steam vaporizer or humidifier in your room or home may help loosen mucus.  Coughing is often worse at night. Sleeping in a semi-upright position in a recliner or using a couple pillows under your head will help with this.  Get rest as you feel it is needed. Your body will usually let you know when you need to rest. PREVENTION A pneumococcal shot (vaccine) is available to prevent a common bacterial cause of pneumonia. This is usually suggested for:  People over 84 years old.  Patients on chemotherapy.  People with chronic lung problems, such as bronchitis or emphysema.  People with immune system problems. If you are over 65 or have a high risk condition, you may receive the pneumococcal vaccine if you have not received it before. In some countries, a routine influenza vaccine is also recommended. This vaccine can help prevent some cases of pneumonia.You may be offered the influenza vaccine as part of your care. If you smoke,  it is time to quit. You may receive instructions on how to stop smoking. Your health care provider can provide medicines and counseling to help you quit. SEEK MEDICAL CARE IF: You have a fever. SEEK IMMEDIATE MEDICAL CARE IF:   Your illness becomes worse. This is especially true if you are elderly or weakened from any other disease.  You cannot control your cough with suppressants and are losing sleep.  You begin coughing up blood.  You develop pain which is getting worse or is uncontrolled with medicines.  Any of the symptoms which initially brought you in for treatment are getting worse rather than better.  You develop shortness of breath or chest pain. MAKE SURE YOU:   Understand these instructions.  Will watch your condition.  Will get help right away if you are not doing well or get worse. Document Released: 06/11/2005 Document Revised: 10/26/2013 Document Reviewed: 08/31/2010 [facility] Patient Information 2015 [place], [place]. This information is not intended to replace advice given to you by your health care provider. Make sure you discuss any questions you have with your health care provider. Chest Pain (Nonspecific) It is often hard to give a specific diagnosis for the cause of chest pain. There is always a chance that your pain could be  related to something serious, such as a heart attack or a blood clot in the lungs. You need to follow up with your health care provider for further evaluation. CAUSES   Heartburn.  Pneumonia or bronchitis.  Anxiety or stress.  Inflammation around your heart (pericarditis) or lung (pleuritis or pleurisy).  A blood clot in the lung.  A collapsed lung (pneumothorax). It can develop suddenly on its own (spontaneous pneumothorax) or from trauma to the chest.  Shingles infection (herpes zoster virus). The chest wall is composed of bones, muscles, and cartilage. Any of these can be the source of the pain.  The bones can be bruised by  injury.  The muscles or cartilage can be strained by coughing or overwork.  The cartilage can be affected by inflammation and become sore (costochondritis). DIAGNOSIS  Lab tests or other studies may be needed to find the cause of your pain. Your health care provider may have you take a test called an ambulatory electrocardiogram (ECG). An ECG records your heartbeat patterns over a 24-hour period. You may also have other tests, such as:  Transthoracic echocardiogram (TTE). During echocardiography, sound waves are used to evaluate how blood flows through your heart.  Transesophageal echocardiogram (TEE).  Cardiac monitoring. This allows your health care provider to monitor your heart rate and rhythm in real time.  Holter monitor. This is a portable device that records your heartbeat and can help diagnose heart arrhythmias. It allows your health care provider to track your heart activity for several days, if needed.  Stress tests by exercise or by giving medicine that makes the heart beat faster. TREATMENT   Treatment depends on what may be causing your chest pain. Treatment may include:  Acid blockers for heartburn.  Anti-inflammatory medicine.  Pain medicine for inflammatory conditions.  Antibiotics if an infection is present.  You may be advised to change lifestyle habits. This includes stopping smoking and avoiding alcohol, caffeine, and chocolate.  You may be advised to keep your head raised (elevated) when sleeping. This reduces the chance of acid going backward from your stomach into your esophagus. Most of the time, nonspecific chest pain will improve within 2-3 days with rest and mild pain medicine.  HOME CARE INSTRUCTIONS   If antibiotics were prescribed, take them as directed. Finish them even if you start to feel better.  For the next few days, avoid physical activities that bring on chest pain. Continue physical activities as directed.  Do not use any tobacco  products, including cigarettes, chewing tobacco, or electronic cigarettes.  Avoid drinking alcohol.  Only take medicine as directed by your health care provider.  Follow your health care provider's suggestions for further testing if your chest pain does not go away.  Keep any follow-up appointments you made. If you do not go to an appointment, you could develop lasting (chronic) problems with pain. If there is any problem keeping an appointment, call to reschedule. SEEK MEDICAL CARE IF:   Your chest pain does not go away, even after treatment.  You have a rash with blisters on your chest.  You have a fever. SEEK IMMEDIATE MEDICAL CARE IF:   You have increased chest pain or pain that spreads to your arm, neck, jaw, back, or abdomen.  You have shortness of breath.  You have an increasing cough, or you cough up blood.  You have severe back or abdominal pain.  You feel nauseous or vomit.  You have severe weakness.  You faint.  You have  chills. This is an emergency. Do not wait to see if the pain will go away. Get medical help at once. Call your local emergency services (911 in [place] Do not drive yourself to the hospital. MAKE SURE YOU:   Understand these instructions.  Will watch your condition.  Will get help right away if you are not doing well or get worse. Document Released: 03/21/2005 Document Revised: 06/16/2013 Document Reviewed: 01/15/2008 [facility] Patient Information 2015 [place], [place]. This information is not intended to replace advice given to you by your health care provider. Make sure you discuss any questions you have with your health care provider.

## 2014-01-22 NOTE — Progress Notes (Signed)
Pharmacy note: lovenox  38 yo male on lovenox for VTE prophylaxis. Wt= 165.2kg, SCr= 1.07 and CrCl > 100.  Body mass index is 55.4 kg/(m^2).  Plan -Will adjust lovenox to 80mg  (0.5mg /kg) [place] daily due to obesity -Please call pharmay with any concerns  Thank you, Harland Germanndrew Kadon Andrus, Pharm D 01/22/2014 11:23 AM

## 2014-10-08 ENCOUNTER — Emergency Department (HOSPITAL_COMMUNITY)
Admission: EM | Admit: 2014-10-08 | Discharge: 2014-10-08 | Disposition: A | Discharge status: Home / Self Care | Payer: No Typology Code available for payment source | Attending: Emergency Medicine | Admitting: Emergency Medicine

## 2014-10-08 ENCOUNTER — Emergency Department (HOSPITAL_COMMUNITY): Payer: No Typology Code available for payment source

## 2014-10-08 ENCOUNTER — Encounter (HOSPITAL_COMMUNITY): Payer: Self-pay | Admitting: Emergency Medicine

## 2014-10-08 DIAGNOSIS — Z7982 Long term (current) use of aspirin: Secondary | ICD-10-CM | POA: Insufficient documentation

## 2014-10-08 DIAGNOSIS — R109 Unspecified abdominal pain: Secondary | ICD-10-CM

## 2014-10-08 DIAGNOSIS — Z8739 Personal history of other diseases of the musculoskeletal system and connective tissue: Secondary | ICD-10-CM | POA: Diagnosis not present

## 2014-10-08 DIAGNOSIS — R1031 Right lower quadrant pain: Secondary | ICD-10-CM | POA: Insufficient documentation

## 2014-10-08 DIAGNOSIS — R739 Hyperglycemia, unspecified: Secondary | ICD-10-CM | POA: Insufficient documentation

## 2014-10-08 DIAGNOSIS — R197 Diarrhea, unspecified: Secondary | ICD-10-CM | POA: Diagnosis not present

## 2014-10-08 DIAGNOSIS — R110 Nausea: Secondary | ICD-10-CM | POA: Insufficient documentation

## 2014-10-08 DIAGNOSIS — I10 Essential (primary) hypertension: Secondary | ICD-10-CM | POA: Diagnosis not present

## 2014-10-08 DIAGNOSIS — Z79899 Other long term (current) drug therapy: Secondary | ICD-10-CM | POA: Diagnosis not present

## 2014-10-08 DIAGNOSIS — Z880 Allergy status to penicillin: Secondary | ICD-10-CM | POA: Insufficient documentation

## 2014-10-08 LAB — COMPREHENSIVE METABOLIC PANEL
ALT: 25 U/L (ref 0–53)
AST: 30 U/L (ref 0–37)
Albumin: 4 g/dL (ref 3.5–5.2)
Alkaline Phosphatase: 91 U/L (ref 39–117)
Anion gap: 10 (ref 5–15)
BUN: 18 mg/dL (ref 6–23)
CHLORIDE: 102 mmol/L (ref 96–112)
CO2: 25 mmol/L (ref 19–32)
Calcium: 8.7 mg/dL (ref 8.4–10.5)
Creatinine, Ser: 1.07 mg/dL (ref 0.50–1.35)
GFR calc Af Amer: >90 mL/min (ref >90)
GFR, EST NON AFRICAN AMERICAN: 86 mL/min — AB (ref >90)
GLUCOSE: 151 mg/dL — AB (ref 70–99)
Potassium: 3.9 mmol/L (ref 3.5–5.1)
SODIUM: 137 mmol/L (ref 135–145)
Total Bilirubin: 0.9 mg/dL (ref 0.3–1.2)
Total Protein: 7.6 g/dL (ref 6.0–8.3)

## 2014-10-08 LAB — CBC WITH DIFFERENTIAL/PLATELET
BASOS ABS: 0.1 10*3/uL (ref 0.0–0.1)
BASOS PCT: 1 % (ref 0–1)
Eosinophils Absolute: 0.2 10*3/uL (ref 0.0–0.7)
Eosinophils Relative: 2 % (ref 0–5)
HCT: 42.7 % (ref 39.0–52.0)
HEMOGLOBIN: 14.3 g/dL (ref 13.0–17.0)
Lymphocytes Relative: 17 % (ref 12–46)
Lymphs Abs: 1.9 10*3/uL (ref 0.7–4.0)
MCH: 29.8 pg (ref 26.0–34.0)
MCHC: 33.5 g/dL (ref 30.0–36.0)
MCV: 89 fL (ref 78.0–100.0)
MONOS PCT: 7 % (ref 3–12)
Monocytes Absolute: 0.8 10*3/uL (ref 0.1–1.0)
Neutro Abs: 8.2 10*3/uL — ABNORMAL HIGH (ref 1.7–7.7)
Neutrophils Relative %: 73 % (ref 43–77)
Platelets: 264 10*3/uL (ref 150–400)
RBC: 4.8 MIL/uL (ref 4.22–5.81)
RDW: 13.4 % (ref 11.5–15.5)
WBC: 11.1 10*3/uL — ABNORMAL HIGH (ref 4.0–10.5)

## 2014-10-08 LAB — URINE MICROSCOPIC-ADD ON

## 2014-10-08 LAB — URINALYSIS, ROUTINE W REFLEX MICROSCOPIC
Bilirubin Urine: NEGATIVE
GLUCOSE, UA: NEGATIVE mg/dL
HGB URINE DIPSTICK: NEGATIVE
KETONES UR: NEGATIVE mg/dL
Nitrite: NEGATIVE
Protein, ur: 100 mg/dL — AB
Specific Gravity, Urine: 1.021 (ref 1.005–1.030)
Urobilinogen, UA: 0.2 mg/dL (ref 0.0–1.0)
pH: 6 (ref 5.0–8.0)

## 2014-10-08 MED ORDER — IOHEXOL 300 MG/ML  SOLN
100.0000 mL | Freq: Once | INTRAMUSCULAR | Status: AC | PRN
  Administered 2014-10-08: 100 mL via INTRAVENOUS

## 2014-10-08 MED ORDER — HYDROCODONE-ACETAMINOPHEN 5-325 MG PO TABS: 1.0000 | ORAL_TABLET | ORAL | Status: DC | PRN

## 2014-10-08 MED ORDER — MORPHINE SULFATE 4 MG/ML IJ SOLN
4.0000 mg | Freq: Once | INTRAMUSCULAR | Status: AC
  Administered 2014-10-08: 4 mg via INTRAVENOUS
  Filled 2014-10-08: qty 1

## 2014-10-08 MED ORDER — ONDANSETRON HCL 4 MG PO TABS: 4.0000 mg | ORAL_TABLET | Freq: Four times a day (QID) | ORAL | Status: DC

## 2014-10-08 MED ORDER — IBUPROFEN 800 MG PO TABS: 800.0000 mg | ORAL_TABLET | Freq: Three times a day (TID) | ORAL | Status: DC

## 2014-10-08 MED ORDER — ONDANSETRON HCL 4 MG/2ML IJ SOLN
4.0000 mg | Freq: Once | INTRAMUSCULAR | Status: AC
  Administered 2014-10-08: 4 mg via INTRAVENOUS
  Filled 2014-10-08: qty 2

## 2014-10-08 MED ORDER — IOHEXOL 300 MG/ML  SOLN
50.0000 mL | Freq: Once | INTRAMUSCULAR | Status: AC | PRN
  Administered 2014-10-08: 50 mL via ORAL

## 2014-10-08 NOTE — ED Notes (Signed)
Pt taking sips of ginger ale, tolerating.

## 2014-10-08 NOTE — Discharge Instructions (Signed)
Abdominal Pain As discussed, you may have passed a kidney stone. Your appendix appears normal. Follow up with your doctor regarding your elevated blood sugar. Return to the ED if you develop new or worsening symptoms. Many things can cause abdominal pain. Usually, abdominal pain is not caused by a disease and will improve without treatment. It can often be observed and treated at home. Your health care provider will do a physical exam and possibly order blood tests and X-rays to help determine the seriousness of your pain. However, in many cases, more time must pass before a clear cause of the pain can be found. Before that point, your health care provider may not know if you need more testing or further treatment. HOME CARE INSTRUCTIONS  Monitor your abdominal pain for any changes. The following actions may help to alleviate any discomfort you are experiencing:  Only take over-the-counter or prescription medicines as directed by your health care provider.  Do not take laxatives unless directed to do so by your health care provider.  Try a clear liquid diet (broth, tea, or water) as directed by your health care provider. Slowly move to a bland diet as tolerated. SEEK MEDICAL CARE IF:  You have unexplained abdominal pain.  You have abdominal pain associated with nausea or diarrhea.  You have pain when you urinate or have a bowel movement.  You experience abdominal pain that wakes you in the night.  You have abdominal pain that is worsened or improved by eating food.  You have abdominal pain that is worsened with eating fatty foods.  You have a fever. SEEK IMMEDIATE MEDICAL CARE IF:   Your pain does not go away within 2 hours.  You keep throwing up (vomiting).  Your pain is felt only in portions of the abdomen, such as the right side or the left lower portion of the abdomen.  You pass bloody or black tarry stools. MAKE SURE YOU:  Understand these instructions.   Will watch your  condition.   Will get help right away if you are not doing well or get worse.  Document Released: 03/21/2005 Document Revised: 06/16/2013 Document Reviewed: 02/18/2013 [facility] Patient Information 2015 [place]. This information is not intended to replace advice given to you by your health care provider. Make sure you discuss any questions you have with your health care provider.

## 2014-10-08 NOTE — ED Notes (Signed)
Per pt, states RLQ pain since yesterday with nausea

## 2014-10-08 NOTE — ED Notes (Signed)
Pt given gingerale for PO challenge 

## 2014-10-08 NOTE — ED Provider Notes (Signed)
CSN: [id number]     Arrival date & ti  10/08/14  0805 History   First MD Initiated Contact with Patient 10/08/14 [phone]     Chief Complaint  Patient presents with  . Abdominal Pain     (Consider location/radiation/quality/duration/timing/severity/associated sxs/prior Treatment) HPI Comments: Patient from home with right-sided lower abdominal pain since yesterday afternoon that is worsening and becoming more constant. It is worse with palpation and movement. Nothing makes it better. Is associated with nausea but no vomiting. Denies fever. Denies any pain with urination or hematuria. No testicular tenderness. One episode of diarrhea this morning. No chest pain or shortness of breath. No back pain. Never had this pain in the past but does have a history of IBS.Marland Kitchenro   The history is provided by the patient and the spouse.    Past Medical History  Diagnosis Date  . Hypertension   . Swelling of lower extremity    History reviewed. No pertinent past surgical history. No family history on file. History  Substance Use Topics  . Smoking status: Never Smoker   . Smokeless tobacco: Never Used  . Alcohol Use: Yes     Comment: ocassionally     Review of Systems  Constitutional: Positive for activity change and appetite change. Negative for fever.  Eyes: Negative for visual disturbance.  Respiratory: Negative for cough, chest tightness and shortness of breath.   Cardiovascular: Negative for chest pain.  Gastrointestinal: Positive for nausea, abdominal pain and diarrhea. Negative for vomiting and constipation.  Genitourinary: Negative for dysuria, hematuria, scrotal swelling and testicular pain.  Musculoskeletal: Negative for myalgias, back pain and arthralgias.  Skin: Negative for wound.  Neurological: Negative for dizziness, weakness and headaches.  A complete 10 system review of systems was obtained and all systems are negative except as noted in the HPI and PMH.     Allergies   Penicillins  Home Medications   Prior to Admission medications   Medication Sig Start Date End Date Taking? Authorizing Provider  allopurinol (ZYLOPRIM) 300 MG tablet Take 300 mg by mouth daily.   Yes Historical Provider, MD  carvedilol (COREG) 25 MG tablet Take 25 mg by mouth 2 (two) times daily with a meal.   Yes Historical Provider, MD  hydrochlorothiazide (HYDRODIURIL) 25 MG tablet Take 25 mg by mouth daily.   Yes Historical Provider, MD  lisinopril (PRINIVIL,ZESTRIL) 40 MG tablet Take 1 tablet (40 mg total) by mouth daily. 01/22/14  Yes Russella Dar, NP  OVER THE COUNTER MEDICATION Take 1 tablet by mouth daily. Diabetes Vitamin   Yes Historical Provider, MD  vitamin B-12 (CYANOCOBALAMIN) 500 MCG tablet Take 500 mcg by mouth daily.   Yes Historical Provider, MD  aspirin EC 325 MG EC tablet Take 1 tablet (325 mg total) by mouth daily. Patient not taking: Reported on 10/08/2014 01/22/14   Russella Dar, NP  atorvastatin (LIPITOR) 40 MG tablet Take 1 tablet (40 mg total) by mouth daily at 6 PM. Patient not taking: Reported on 10/08/2014 01/22/14   Russella Dar, NP  cloNIDine (CATAPRES - DOSED IN MG/24 HR) 0.2 mg/24hr patch Place 1 patch (0.2 mg total) onto the skin once a week. Patient not taking: Reported on 10/08/2014 01/22/14   Russella Dar, NP  guaiFENesin-dextromethorphan [facility] DM) 100-10 MG/5ML syrup Take 5 mLs by mouth every 4 (four) hours as needed for cough. Patient not taking: Reported on 10/08/2014 01/22/14   Russella Dar, NP  HYDROcodone-acetaminophen (NORCO/VICODIN) 5-325 MG per tablet Take  1 tablet by mouth every 4 (four) hours as needed. 10/08/14   Glynn OctaveStephen Akeel Reffner, MD  ibuprofen (ADVIL,MOTRIN) 800 MG tablet Take 1 tablet (800 mg total) by mouth 3 (three) times daily. 10/08/14   Glynn OctaveStephen Hopie Pellegrin, MD  levofloxacin (LEVAQUIN) 500 MG tablet Take 1 tablet (500 mg total) by mouth daily. Patient not taking: Reported on 10/08/2014 01/22/14   Russella DarAllison L Ellis, NP  ondansetron  (ZOFRAN) 4 MG tablet Take 1 tablet (4 mg total) by mouth every 6 (six) hours. 10/08/14   Glynn OctaveStephen Caela Huot, MD  oxyCODONE (OXY IR/ROXICODONE) 5 MG immediate release tablet Take 1 tablet (5 mg total) by mouth every 4 (four) hours as needed for moderate pain. Patient not taking: Reported on 10/08/2014 01/22/14   Russella DarAllison L Ellis, NP   BP 156/92 mmHg  Pulse 75  Temp(Src) 98.2 F (36.8 C) (Oral)  Resp 18  SpO2 98% Physical Exam  Constitutional: He is oriented to person, place, and time. He appears well-developed and well-nourished. No distress.  HENT:  Head: Normocephalic and atraumatic.  Mouth/Throat: Oropharynx is clear and moist. No oropharyngeal exudate.  Eyes: Conjunctivae and EOM are normal. Pupils are equal, round, and reactive to light.  Neck: Normal range of motion. Neck supple.  No meningismus.  Cardiovascular: Normal rate, regular rhythm, normal heart sounds and intact distal pulses.   No murmur heard. Pulmonary/Chest: Effort normal and breath sounds normal. No respiratory distress.  Abdominal: Soft. There is tenderness. There is no rebound and no guarding.  Morbidly obese. Exam limited by body habitus. Tenderness right lower quadrant underneath pannus. Guarding without rebound.  Genitourinary:  No testicular tenderness, no appreciable hernia  Musculoskeletal: Normal range of motion. He exhibits no edema or tenderness.  Neurological: He is alert and oriented to person, place, and time. No cranial nerve deficit. He exhibits normal muscle tone. Coordination normal.  No ataxia on finger to nose bilaterally. No pronator drift. 5/5 strength throughout. CN 2-12 intact. Negative Romberg. Equal grip strength. Sensation intact. Gait is normal.   Skin: Skin is warm.  Psychiatric: He has a normal mood and affect. His behavior is normal.  Nursing note and vitals reviewed.   ED Course  Procedures (including critical care time) Labs Review Labs Reviewed  URINALYSIS, ROUTINE W REFLEX  MICROSCOPIC - Abnormal; Notable for the following:    Protein, ur 100 (*)    Leukocytes, UA TRACE (*)    All other components within normal limits  CBC WITH DIFFERENTIAL/PLATELET - Abnormal; Notable for the following:    WBC 11.1 (*)    Neutro Abs 8.2 (*)    All other components within normal limits  COMPREHENSIVE METABOLIC PANEL - Abnormal; Notable for the following:    Glucose, Bld 151 (*)    GFR calc non Af Amer 86 (*)    All other components within normal limits  URINE MICROSCOPIC-ADD ON    Imaging Review Ct Abdomen Pelvis W Contrast  10/08/2014   CLINICAL DATA:  Acute right lower quadrant abdominal pain.  EXAM: CT ABDOMEN AND PELVIS WITH CONTRAST  TECHNIQUE: Multidetector CT imaging of the abdomen and pelvis was performed using the standard protocol following bolus administration of intravenous contrast.  CONTRAST:  100mL OMNIPAQUE IOHEXOL 300 MG/ML  SOLN  COMPARISON:  CT scan of Nov 06, 2008.  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 Visualized lung bases appear normal.  Fatty infiltration of the liver is noted. No gallstones are noted. Pancreas and spleen appear normal. Adrenal glands appear normal. Bilateral nonobstructive  nephrolithiasis is noted. No hydronephrosis or renal obstruction is noted. No ureteral calculi are noted. The appendix appears normal. There is no evidence of bowel obstruction. No abnormal fluid collection is noted. Urinary bladder appears normal. No significant adenopathy is noted.  IMPRESSION: Bilateral nonobstructive nephrolithiasis. No hydronephrosis or renal obstruction is noted.  Fatty infiltration of the liver.  No other significant abnormality seen in the abdomen or pelvis.   Electronically Signed   By: Lupita Raider, M.D.   On: 10/08/2014 10:18     EKG Interpretation None      MDM   Final diagnoses:  Abdominal pain, unspecified abdominal location  Hyperglycemia   Right lower quadrant pain with nausea since yesterday. Exam concerning for  appendicitis.  Also consider kidney stone.   BP elevated. Patient's pain. He did take his medication this morning. Urinalysis shows no infection or hematuria. He does have proteinuria.  CT shows normal appendix. There is bilateral nonobstructive nephrolithiasis. Patient may have passed a kidney stone even though his urine is negative.  He has an elevated blood sugar of 151. This needs PCP follow up as he is not known to be a diabetic. Patient understands that this needs followup.  We'll treat as possible kidney stone.   Patient in no distress. Tolerating PO.  Advised to followup with PCP. Return precautions discussed.   Glynn Octave, MD 10/08/14 1105

## 2014-10-10 ENCOUNTER — Emergency Department (HOSPITAL_COMMUNITY)
Admission: EM | Admit: 2014-10-10 | Discharge: 2014-10-10 | Disposition: A | Discharge status: Home / Self Care | Payer: No Typology Code available for payment source | Attending: Emergency Medicine | Admitting: Emergency Medicine

## 2014-10-10 ENCOUNTER — Encounter (HOSPITAL_COMMUNITY): Payer: Self-pay

## 2014-10-10 DIAGNOSIS — K589 Irritable bowel syndrome without diarrhea: Secondary | ICD-10-CM | POA: Insufficient documentation

## 2014-10-10 DIAGNOSIS — Z8719 Personal history of other diseases of the digestive system: Secondary | ICD-10-CM | POA: Insufficient documentation

## 2014-10-10 DIAGNOSIS — I10 Essential (primary) hypertension: Secondary | ICD-10-CM | POA: Insufficient documentation

## 2014-10-10 DIAGNOSIS — N23 Unspecified renal colic: Secondary | ICD-10-CM | POA: Insufficient documentation

## 2014-10-10 DIAGNOSIS — Z7982 Long term (current) use of aspirin: Secondary | ICD-10-CM | POA: Diagnosis not present

## 2014-10-10 DIAGNOSIS — Z880 Allergy status to penicillin: Secondary | ICD-10-CM | POA: Diagnosis not present

## 2014-10-10 DIAGNOSIS — R1031 Right lower quadrant pain: Secondary | ICD-10-CM | POA: Diagnosis present

## 2014-10-10 DIAGNOSIS — R109 Unspecified abdominal pain: Secondary | ICD-10-CM

## 2014-10-10 HISTORY — DX: Irritable bowel syndrome, unspecified: K58.9

## 2014-10-10 LAB — CBC WITH DIFFERENTIAL/PLATELET
Basophils Absolute: 0.1 10*3/uL (ref 0.0–0.1)
Basophils Relative: 1 % (ref 0–1)
EOS PCT: 2 % (ref 0–5)
Eosinophils Absolute: 0.2 10*3/uL (ref 0.0–0.7)
HEMATOCRIT: 43.7 % (ref 39.0–52.0)
HEMOGLOBIN: 14.5 g/dL (ref 13.0–17.0)
Lymphocytes Relative: 17 % (ref 12–46)
Lymphs Abs: 2.2 10*3/uL (ref 0.7–4.0)
MCH: 30.1 pg (ref 26.0–34.0)
MCHC: 33.2 g/dL (ref 30.0–36.0)
MCV: 90.7 fL (ref 78.0–100.0)
MONO ABS: 0.9 10*3/uL (ref 0.1–1.0)
Monocytes Relative: 7 % (ref 3–12)
NEUTROS PCT: 73 % (ref 43–77)
Neutro Abs: 9.8 10*3/uL — ABNORMAL HIGH (ref 1.7–7.7)
Platelets: 305 10*3/uL (ref 150–400)
RBC: 4.82 MIL/uL (ref 4.22–5.81)
RDW: 13.6 % (ref 11.5–15.5)
WBC: 13.2 10*3/uL — ABNORMAL HIGH (ref 4.0–10.5)

## 2014-10-10 LAB — URINE MICROSCOPIC-ADD ON

## 2014-10-10 LAB — URINALYSIS, ROUTINE W REFLEX MICROSCOPIC
Bilirubin Urine: NEGATIVE
Glucose, UA: NEGATIVE mg/dL
Hgb urine dipstick: NEGATIVE
KETONES UR: NEGATIVE mg/dL
LEUKOCYTES UA: NEGATIVE
NITRITE: NEGATIVE
PROTEIN: 30 mg/dL — AB
SPECIFIC GRAVITY, URINE: 1.023 (ref 1.005–1.030)
UROBILINOGEN UA: 0.2 mg/dL (ref 0.0–1.0)
pH: 5.5 (ref 5.0–8.0)

## 2014-10-10 LAB — COMPREHENSIVE METABOLIC PANEL
ALK PHOS: 100 U/L (ref 39–117)
ALT: 23 U/L (ref 0–53)
AST: 20 U/L (ref 0–37)
Albumin: 4 g/dL (ref 3.5–5.2)
Anion gap: 10 (ref 5–15)
BUN: 20 mg/dL (ref 6–23)
CALCIUM: 9 mg/dL (ref 8.4–10.5)
CHLORIDE: 106 mmol/L (ref 96–112)
CO2: 25 mmol/L (ref 19–32)
Creatinine, Ser: 1.15 mg/dL (ref 0.50–1.35)
GFR, EST NON AFRICAN AMERICAN: 79 mL/min — AB (ref >90)
Glucose, Bld: 122 mg/dL — ABNORMAL HIGH (ref 70–99)
Potassium: 3.8 mmol/L (ref 3.5–5.1)
Sodium: 141 mmol/L (ref 135–145)
Total Bilirubin: 0.3 mg/dL (ref 0.3–1.2)
Total Protein: 7.7 g/dL (ref 6.0–8.3)

## 2014-10-10 LAB — LIPASE, BLOOD: Lipase: 40 U/L (ref 11–59)

## 2014-10-10 MED ORDER — SODIUM CHLORIDE 0.9 % IV BOLUS (SEPSIS)
1000.0000 mL | Freq: Once | INTRAVENOUS | Status: AC
  Administered 2014-10-10: 1000 mL via INTRAVENOUS

## 2014-10-10 MED ORDER — ONDANSETRON HCL 4 MG/2ML IJ SOLN
4.0000 mg | Freq: Once | INTRAMUSCULAR | Status: AC
  Administered 2014-10-10: 4 mg via INTRAVENOUS
  Filled 2014-10-10: qty 2

## 2014-10-10 MED ORDER — TAMSULOSIN HCL 0.4 MG PO CAPS: 0.4000 mg | ORAL_CAPSULE | Freq: Every day | ORAL | Status: DC

## 2014-10-10 MED ORDER — HYDROCODONE-ACETAMINOPHEN 5-325 MG PO TABS
1.0000 | ORAL_TABLET | Freq: Four times a day (QID) | ORAL | Status: DC | PRN
Start: 2014-10-10 — End: 2014-10-29

## 2014-10-10 MED ORDER — HYDROMORPHONE HCL 1 MG/ML IJ SOLN
1.0000 mg | Freq: Once | INTRAMUSCULAR | Status: AC
  Administered 2014-10-10: 1 mg via INTRAVENOUS
  Filled 2014-10-10: qty 1

## 2014-10-10 MED ORDER — KETOROLAC TROMETHAMINE 30 MG/ML IJ SOLN
30.0000 mg | Freq: Once | INTRAMUSCULAR | Status: AC
  Administered 2014-10-10: 30 mg via INTRAVENOUS
  Filled 2014-10-10: qty 1

## 2014-10-10 NOTE — Discharge Instructions (Signed)
Flank Pain °Flank pain refers to pain that is located on the side of the body between the upper abdomen and the back. The pain may occur over a short period of time (acute) or may be long-term or reoccurring (chronic). It may be mild or severe. Flank pain can be caused by many things. °CAUSES  °Some of the more common causes of flank pain include: °· Muscle strains.   °· Muscle spasms.   °· A disease of your spine (vertebral disk disease).   °· A lung infection (pneumonia).   °· Fluid around your lungs (pulmonary edema).   °· A kidney infection.   °· Kidney stones.   °· A very painful skin rash caused by the chickenpox virus (shingles).   °· Gallbladder disease.   °HOME CARE INSTRUCTIONS  °Home care will depend on the cause of your pain. In general, °· Rest as directed by your caregiver. °· Drink enough fluids to keep your urine clear or pale yellow. °· Only take over-the-counter or prescription medicines as directed by your caregiver. Some medicines may help relieve the pain. °· Tell your caregiver about any changes in your pain. °· Follow up with your caregiver as directed. °SEEK IMMEDIATE MEDICAL CARE IF:  °· Your pain is not controlled with medicine.   °· You have new or worsening symptoms. °· Your pain increases.   °· You have abdominal pain.   °· You have shortness of breath.   °· You have persistent nausea or vomiting.   °· You have swelling in your abdomen.   °· You feel faint or pass out.   °· You have blood in your urine. °· You have a fever or persistent symptoms for more than 2-3 days. °· You have a fever and your symptoms suddenly get worse. °MAKE SURE YOU:  °· Understand these instructions. °· Will watch your condition. °· Will get help right away if you are not doing well or get worse. °Document Released: 08/02/2005 Document Revised: 03/05/2012 Document Reviewed: 01/24/2012 °Exit[employer]® Patient Information ©2015 [employer]. This information is not intended to replace advice given to you by your  health care provider. Make sure you discuss any questions you have with your health care provider. ° °Kidney Stones °Kidney stones (urolithiasis) are deposits that form inside your kidneys. The intense pain is caused by the stone moving through the urinary tract. When the stone moves, the ureter goes into spasm around the stone. The stone is usually passed in the urine.  °CAUSES  °· A disorder that makes certain neck glands produce too much parathyroid hormone (primary hyperparathyroidism). °· A buildup of uric acid crystals, similar to gout in your joints. °· Narrowing (stricture) of the ureter. °· A kidney obstruction present at birth (congenital obstruction). °· Previous surgery on the kidney or ureters. °· Numerous kidney infections. °SYMPTOMS  °· Feeling sick to your stomach (nauseous). °· Throwing up (vomiting). °· Blood in the urine (hematuria). °· Pain that usually spreads (radiates) to the groin. °· Frequency or urgency of urination. °DIAGNOSIS  °· Taking a history and physical exam. °· Blood or urine tests. °· CT scan. °· Occasionally, an examination of the inside of the urinary bladder (cystoscopy) is performed. °TREATMENT  °· Observation. °· Increasing your fluid intake. °· Extracorporeal shock wave lithotripsy--This is a noninvasive procedure that uses shock waves to break up kidney stones. °· Surgery may be needed if you have severe pain or persistent obstruction. There are various surgical procedures. Most of the procedures are performed with the use of small instruments. Only small incisions are needed to accommodate these instruments,   so recovery time is minimized. °The size, location, and chemical composition are all important variables that will determine the proper choice of action for you. Talk to your health care provider to better understand your situation so that you will minimize the risk of injury to yourself and your kidney.  °HOME CARE INSTRUCTIONS  °· Drink enough water and fluids to  keep your urine clear or pale yellow. This will help you to pass the stone or stone fragments. °· Strain all urine through the provided strainer. Keep all particulate matter and stones for your health care provider to see. The stone causing the pain may be as small as a grain of salt. It is very important to use the strainer each and every time you pass your urine. The collection of your stone will allow your health care provider to analyze it and verify that a stone has actually passed. The stone analysis will often identify what you can do to reduce the incidence of recurrences. °· Only take over-the-counter or prescription medicines for pain, discomfort, or fever as directed by your health care provider. °· Make a follow-up appointment with your health care provider as directed. °· Get follow-up X-rays if required. The absence of pain does not always mean that the stone has passed. It may have only stopped moving. If the urine remains completely obstructed, it can cause loss of kidney function or even complete destruction of the kidney. It is your responsibility to make sure X-rays and follow-ups are completed. Ultrasounds of the kidney can show blockages and the status of the kidney. Ultrasounds are not associated with any radiation and can be performed easily in a matter of minutes. °SEEK MEDICAL CARE IF: °· You experience pain that is progressive and unresponsive to any pain medicine you have been prescribed. °SEEK IMMEDIATE MEDICAL CARE IF:  °· Pain cannot be controlled with the prescribed medicine. °· You have a fever or shaking chills. °· The severity or intensity of pain increases over 18 hours and is not relieved by pain medicine. °· You develop a new onset of abdominal pain. °· You feel faint or pass out. °· You are unable to urinate. °MAKE SURE YOU:  °· Understand these instructions. °· Will watch your condition. °· Will get help right away if you are not doing well or get worse. °Document Released:  06/11/2005 Document Revised: 02/11/2013 Document Reviewed: 11/12/2012 °ExitCare® Patient Information ©2015 [employer]. This information is not intended to replace advice given to you by your health care provider. Make sure you discuss any questions you have with your health care provider. ° °

## 2014-10-10 NOTE — ED Provider Notes (Signed)
CSN: [id number]     Arrival date & time 10/10/14  1404 History   First MD Initiated Contact with Patient 10/10/14 1459     Chief Complaint  Patient presents with  . Abdominal Pain    Patient is a 39 y.o. male presenting with abdominal pain. The history is provided by the patient. No language interpreter was used.  Abdominal Pain  [name] presents for evaluation of abdominal pain/back pain.  He developed RLQ pain three days ago.  He was evaluated in the ED two days ago.  His RLQ pain is improved but he does have increased right low back pain.  The pain is described as sharp and constant in nature.  It is nonradiating.  He reports associated nausea.  Denies any fevers, dysuria, hematuria, diarrhea, constipation, testicular pain.  He has a hx/o IBS.  Sxs are moderate, constant, worsening.    Past Medical History  Diagnosis Date  . Hypertension   . Swelling of lower extremity   . Irritable bowel syndrome (IBS)    No past surgical history on file. No family history on file. History  Substance Use Topics  . Smoking status: Never Smoker   . Smokeless tobacco: Never Used  . Alcohol Use: Yes     Comment: ocassionally     Review of Systems  Gastrointestinal: Positive for abdominal pain.  All other systems reviewed and are negative.     Allergies  Penicillins  Home Medications   Prior to Admission medications   Medication Sig Start Date End Date Taking? Authorizing Provider  allopurinol (ZYLOPRIM) 300 MG tablet Take 300 mg by mouth daily.   Yes Historical Provider, MD  carvedilol (COREG) 25 MG tablet Take 25 mg by mouth 2 (two) times daily with a meal.   Yes Historical Provider, MD  Cholecalciferol (VITAMIN D3) 5000 UNITS TABS Take 1 tablet by mouth daily.   Yes Historical Provider, MD  hydrochlorothiazide (HYDRODIURIL) 25 MG tablet Take 25 mg by mouth daily.   Yes Historical Provider, MD  HYDROcodone-acetaminophen (NORCO/VICODIN) 5-325 MG per tablet Take 1 tablet by mouth  every 4 (four) hours as needed. Patient taking differently: Take 1 tablet by mouth every 4 (four) hours as needed for moderate pain.  10/08/14  Yes Glynn Octave, MD  ibuprofen (ADVIL,MOTRIN) 800 MG tablet Take 1 tablet (800 mg total) by mouth 3 (three) times daily. 10/08/14  Yes Glynn Octave, MD  lisinopril (PRINIVIL,ZESTRIL) 40 MG tablet Take 1 tablet (40 mg total) by mouth daily. 01/22/14  Yes Russella Dar, NP  ondansetron (ZOFRAN) 4 MG tablet Take 1 tablet (4 mg total) by mouth every 6 (six) hours. 10/08/14  Yes Glynn Octave, MD  vitamin B-12 (CYANOCOBALAMIN) 500 MCG tablet Take 500 mcg by mouth daily.   Yes Historical Provider, MD  aspirin EC 325 MG EC tablet Take 1 tablet (325 mg total) by mouth daily. Patient not taking: Reported on 10/08/2014 01/22/14   Russella Dar, NP  atorvastatin (LIPITOR) 40 MG tablet Take 1 tablet (40 mg total) by mouth daily at 6 PM. Patient not taking: Reported on 10/08/2014 01/22/14   Russella Dar, NP  cloNIDine (CATAPRES - DOSED IN MG/24 HR) 0.2 mg/24hr patch Place 1 patch (0.2 mg total) onto the skin once a week. Patient not taking: Reported on 10/08/2014 01/22/14   Russella Dar, NP  guaiFENesin-dextromethorphan [facility] DM) 100-10 MG/5ML syrup Take 5 mLs by mouth every 4 (four) hours as needed for cough. Patient not taking: Reported on  10/08/2014 01/22/14   Russella DarAllison L Ellis, NP  levofloxacin (LEVAQUIN) 500 MG tablet Take 1 tablet (500 mg total) by mouth daily. Patient not taking: Reported on 10/08/2014 01/22/14   Russella DarAllison L Ellis, NP  oxyCODONE (OXY IR/ROXICODONE) 5 MG immediate release tablet Take 1 tablet (5 mg total) by mouth every 4 (four) hours as needed for moderate pain. Patient not taking: Reported on 10/08/2014 01/22/14   Russella DarAllison L Ellis, NP   BP 134/74 mmHg  Pulse 86  Temp(Src) 98.3 F (36.8 C) (Oral)  Resp 20  SpO2 97% Physical Exam  Constitutional: He is oriented to person, place, and time. He appears well-developed and well-nourished.   HENT:  Head: Normocephalic and atraumatic.  Cardiovascular: Normal rate and regular rhythm.   No murmur heard. Pulmonary/Chest: Effort normal and breath sounds normal. No respiratory distress.  Abdominal: Soft. There is no rebound and no guarding.  Right low back tenderness to palpation, mild RLQ tenderness without guarding/rebound.    Genitourinary:  No hernia, no testicular tenderness or swelling.  2+ femoral pulses bilaterally.    Musculoskeletal: He exhibits no edema or tenderness.  Neurological: He is alert and oriented to person, place, and time.  Skin: Skin is warm and dry.  Psychiatric: He has a normal mood and affect. His behavior is normal.  Nursing note and vitals reviewed.   ED Course  Procedures (including critical care time) Labs Review Labs Reviewed  CBC WITH DIFFERENTIAL/PLATELET - Abnormal; Notable for the following:    WBC 13.2 (*)    Neutro Abs 9.8 (*)    All other components within normal limits  COMPREHENSIVE METABOLIC PANEL - Abnormal; Notable for the following:    Glucose, Bld 122 (*)    GFR calc non Af Amer 79 (*)    All other components within normal limits  URINALYSIS, ROUTINE W REFLEX MICROSCOPIC - Abnormal; Notable for the following:    Protein, ur 30 (*)    All other components within normal limits  LIPASE, BLOOD  URINE MICROSCOPIC-ADD ON    Imaging Review No results found.   EKG Interpretation None      MDM   Final diagnoses:  Flank pain, acute  Renal colic on right side    Patient here for evaluation of right flank pain. Had an evaluation in emergency department 2 days ago for right lower quadrant pain and had labs and CT scan performed at that time. The right lower quadrant pain is improved and now he has increased right low back pain. History and presentation is not consistent with acute appendicitis, testicular torsion, pyelonephritis, epidural abscess. Patient feels improved after pain medications and IV fluids in the emergency  department. Repeat CT scan and labs from prior visit. There is a mild increase in his white blood cell count from prior, but he always seems to have some degree of leukocytosis on his labs. UA is not consistent with UTI. BMP was stable renal function. Discussed with patient likely renal colic with urology and PCP follow-up. Close return precautions were discussed for development of new or concerning symptoms such as fevers, vomiting, worsening abdominal pain.    Tilden FossaElizabeth Forest Pruden, MD 10/10/14 [phone]

## 2014-10-10 NOTE — ED Notes (Signed)
Pt reports that his flank pain that he was seen for Friday has mostly resolved but now pain is to R mid back and is tender to touch. Pt sts that he has had dysuria today but denies hematuria. Pt also denies emesis but sts nausea. Pt is A&O and in NAD

## 2014-10-10 NOTE — ED Notes (Signed)
He c/o abd. Pain which began Thursday.  It localized to rlq area; for which he was seen here.  He states the pain has now migrated to bilat. Flanks area; plus he has some mild urinary urgency.  He denies fever/chills and is in no distress.

## 2014-10-13 ENCOUNTER — Ambulatory Visit (INDEPENDENT_AMBULATORY_CARE_PROVIDER_SITE_OTHER): Payer: No Typology Code available for payment source | Admitting: Emergency Medicine

## 2014-10-13 VITALS — BP 155/103 | HR 73 | Temp 98.5°F | Resp 16 | Ht 67.0 in | Wt 382.4 lb

## 2014-10-13 DIAGNOSIS — N200 Calculus of kidney: Secondary | ICD-10-CM

## 2014-10-13 DIAGNOSIS — I10 Essential (primary) hypertension: Secondary | ICD-10-CM | POA: Diagnosis not present

## 2014-10-13 HISTORY — DX: Morbid (severe) obesity due to excess calories: E66.01

## 2014-10-13 HISTORY — DX: Essential (primary) hypertension: I10

## 2014-10-13 NOTE — Patient Instructions (Signed)

## 2014-10-13 NOTE — Progress Notes (Signed)
[facility] [address], [place] [id number] [phone]  Date:  10/13/2014   Name:  Troy RaiderJohn G Simoneau   DOB:  Jul 17, 1975   MRN:  [id number]  PCP:  Treasa SchoolLAND, PHILLIP, PA-C    Chief Complaint: Nephrolithiasis   History of Present Illness:  Troy Rogers is a 39 y.o. very pleasant male patient who presents with the following:  Patient seen in [facility] twice this weekend for bilateral nephrolithiasis Put on flomax and vicodin Has less pain but still has hematuria No nausea or vomiting.  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  Failed a sleep study in the past but never followed up. Denies other complaint or health concern today.   Patient Active Problem List   Diagnosis Date Noted  . Essential hypertension 10/13/2014  . Morbid obesity 10/13/2014  . Irritable bowel syndrome (IBS)   . Chest pain 01/21/2014    Past Medical History  Diagnosis Date  . Hypertension   . Swelling of lower extremity   . Irritable bowel syndrome (IBS)     Past Surgical History  Procedure Laterality Date  . Eye surgery      History  Substance Use Topics  . Smoking status: Never Smoker   . Smokeless tobacco: Never Used  . Alcohol Use: Yes     Comment: ocassionally     Family History  Problem Relation Age of Onset  . Heart disease Mother   . Heart disease Father     Allergies  Allergen Reactions  . Penicillins Hives    Medication list has been reviewed and updated.  Current Outpatient Prescriptions on File Prior to Visit  Medication Sig Dispense Refill  . allopurinol (ZYLOPRIM) 300 MG tablet Take 300 mg by mouth daily.    . carvedilol (COREG) 25 MG tablet Take 25 mg by mouth 2 (two) times daily with a meal.    . Cholecalciferol (VITAMIN D3) 5000 UNITS TABS Take 1 tablet by mouth daily.    . hydrochlorothiazide (HYDRODIURIL) 25 MG tablet Take 25 mg by mouth daily.    Marland Kitchen.  HYDROcodone-acetaminophen (NORCO/VICODIN) 5-325 MG per tablet Take 1 tablet by mouth every 6 (six) hours as needed. 12 tablet 0  . ibuprofen (ADVIL,MOTRIN) 800 MG tablet Take 1 tablet (800 mg total) by mouth 3 (three) times daily. 21 tablet 0  . lisinopril (PRINIVIL,ZESTRIL) 40 MG tablet Take 1 tablet (40 mg total) by mouth daily. 30 tablet 0  . ondansetron (ZOFRAN) 4 MG tablet Take 1 tablet (4 mg total) by mouth every 6 (six) hours. 12 tablet 0  . tamsulosin (FLOMAX) 0.4 MG CAPS capsule Take 1 capsule (0.4 mg total) by mouth daily. 14 capsule 0  . vitamin B-12 (CYANOCOBALAMIN) 500 MCG tablet Take 500 mcg by mouth daily.    . [DISCONTINUED] cloNIDine (CATAPRES - DOSED IN MG/24 HR) 0.2 mg/24hr patch Place 1 patch (0.2 mg total) onto the skin once a week. (Patient not taking: Reported on 10/08/2014) 4 patch 12   No current facility-administered medications on file prior to visit.    Review of Systems:  As per HPI, otherwise negative.    Physical Examination: Filed Vitals:   10/13/14 1830  BP: 155/103  Pulse: 73  Temp: 98.5 F (36.9 C)  Resp: 16   Filed Vitals:   10/13/14 1830  Height: 5\' 7"  (1.702 m)  Weight: 382 lb 6 oz (  173.444 kg)   Body mass index is 59.87 kg/(m^2). Ideal Body Weight: Weight in (lb) to have BMI = 25: 159.3  GEN: morbid obesity, NAD, Non-toxic, A & O x 3 HEENT: Atraumatic, Normocephalic. Neck supple. No masses, No LAD. Ears and Nose: No external deformity. CV: RRR, No M/G/R. No JVD. No thrill. No extra heart sounds. PULM: CTA B, no wheezes, crackles, rhonchi. No retractions. No resp. distress. No accessory muscle use. ABD: S, NT, ND, +BS. No rebound. No HSM. EXTR: No c/c/e NEURO Normal gait.  PSYCH: Normally interactive. Conversant. Not depressed or anxious appearing.  Calm demeanor.    Assessment and Plan: Hypertension undertreated Has appt with new FMD in 2 weeks OSA Failed sleep study needs to follow up  Morbid obesity Nephrolithiasis and  hematuria Urology   Signed,  Phillips Odor, MD

## 2014-10-27 ENCOUNTER — Ambulatory Visit: Payer: Managed Care, Other (non HMO) | Admitting: Internal Medicine

## 2014-10-29 ENCOUNTER — Ambulatory Visit (INDEPENDENT_AMBULATORY_CARE_PROVIDER_SITE_OTHER): Payer: No Typology Code available for payment source | Admitting: Internal Medicine

## 2014-10-29 ENCOUNTER — Encounter: Payer: Self-pay | Admitting: Internal Medicine

## 2014-10-29 VITALS — BP 130/80 | HR 87 | Temp 98.3°F | Ht 67.0 in | Wt 377.0 lb

## 2014-10-29 DIAGNOSIS — M109 Gout, unspecified: Secondary | ICD-10-CM | POA: Insufficient documentation

## 2014-10-29 DIAGNOSIS — N200 Calculus of kidney: Secondary | ICD-10-CM | POA: Insufficient documentation

## 2014-10-29 DIAGNOSIS — G4733 Obstructive sleep apnea (adult) (pediatric): Secondary | ICD-10-CM | POA: Diagnosis not present

## 2014-10-29 DIAGNOSIS — I10 Essential (primary) hypertension: Secondary | ICD-10-CM

## 2014-10-29 DIAGNOSIS — M1000 Idiopathic gout, unspecified site: Secondary | ICD-10-CM

## 2014-10-29 NOTE — Patient Instructions (Signed)
Consider increasing citrate containing foods.

## 2014-10-29 NOTE — Progress Notes (Signed)
Subjective:    Patient ID: Troy Rogers, male    DOB: Jan 01, 1976, 39 y.o.   MRN: [id number]  HPI Here to establish--with fiancee  Finally passed kidney stone after last visit Stabbing pain and hematuria Still worked Had referral to urology--he called but never got appt because they were nonobstructing This is his first spell  Has known gout No stone to analyze Just put back on the allopurinol  Has HTN for some time Had bad reaction to one medicine--doesn't remember what it was Goes back since age 39 Occasional headaches--migraines in past No chest pain No SOB No dizziness or syncope Ran out of carvedilol and hasn't taken over the past week or so  Has known sleep apnea Had sleep study 2005 or so Was supposed to return after abnormal--but never did  Current Outpatient Prescriptions on File Prior to Visit  Medication Sig Dispense Refill  . allopurinol (ZYLOPRIM) 300 MG tablet Take 300 mg by mouth daily.    . carvedilol (COREG) 25 MG tablet Take 25 mg by mouth 2 (two) times daily with a meal.    . Cholecalciferol (VITAMIN D3) 5000 UNITS TABS Take 1 tablet by mouth daily.    . hydrochlorothiazide (HYDRODIURIL) 25 MG tablet Take 25 mg by mouth daily.    Marland Kitchen. ibuprofen (ADVIL,MOTRIN) 800 MG tablet Take 1 tablet (800 mg total) by mouth 3 (three) times daily. 21 tablet 0  . lisinopril (PRINIVIL,ZESTRIL) 40 MG tablet Take 1 tablet (40 mg total) by mouth daily. 30 tablet 0  . ondansetron (ZOFRAN) 4 MG tablet Take 1 tablet (4 mg total) by mouth every 6 (six) hours. 12 tablet 0  . tamsulosin (FLOMAX) 0.4 MG CAPS capsule Take 1 capsule (0.4 mg total) by mouth daily. 14 capsule 0  . vitamin B-12 (CYANOCOBALAMIN) 500 MCG tablet Take 500 mcg by mouth daily.    . [DISCONTINUED] cloNIDine (CATAPRES - DOSED IN MG/24 HR) 0.2 mg/24hr patch Place 1 patch (0.2 mg total) onto the skin once a week. (Patient not taking: Reported on 10/08/2014) 4 patch 12   No current facility-administered medications  on file prior to visit.    Allergies  Allergen Reactions  . Penicillins Hives    Past Medical History  Diagnosis Date  . Hypertension   . Nephrolithiasis   . Irritable bowel syndrome (IBS)   . Obstructive sleep apnea   . Gout     Past Surgical History  Procedure Laterality Date  . Strabismus surgery  1983    Family History  Problem Relation Age of Onset  . Heart disease Mother   . COPD Mother   . Stroke Mother     TIA vs very mild  . Heart disease Father     History   Social History  . Marital Status: Divorced    Spouse Name: N/A  . Number of Children: 0  . Years of Education: N/A   Occupational History  . Apartment maintenance    Social History Main Topics  . Smoking status: Never Smoker   . Smokeless tobacco: Never Used  . Alcohol Use: Yes     Comment: ocassionally   . Drug Use: No  . Sexual Activity: Not on file   Other Topics Concern  . Not on file   Social History Narrative   Review of Systems  Constitutional: Negative for fatigue.       Has lost some weight recently Trying to work out and eat better  HENT: Negative for hearing loss and  tinnitus.   Eyes: Negative for visual disturbance.       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   Cardiovascular: Positive for leg swelling. Negative for chest pain and palpitations.  Gastrointestinal: Negative for nausea, vomiting, constipation and blood in stool.  Endocrine: Negative for polydipsia and polyuria.  Genitourinary: Positive for hematuria. Negative for urgency and difficulty urinating.  Musculoskeletal: Positive for arthralgias.       Left elbow pain for a week--some swelling  Skin: Negative for rash.       Cellulitis in legs years ago  Allergic/Immunologic: Positive for environmental allergies. Negative for immunocompromised state.  Neurological: Positive for headaches. Negative for dizziness, syncope and light-headedness.   Hematological: Negative for adenopathy. Does not bruise/bleed easily.  Psychiatric/Behavioral: Positive for sleep disturbance. Negative for dysphoric mood. The patient is nervous/anxious.        Objective:   Physical Exam  Constitutional: No distress.  obese  Neck: Normal range of motion. Neck supple. No thyromegaly present.  Cardiovascular: Normal rate, regular rhythm, normal heart sounds and intact distal pulses.  Exam reveals no gallop.   No murmur heard. Pulmonary/Chest: Effort normal and breath sounds normal. No respiratory distress. He has no wheezes. He has no rales.  Abdominal: Soft. There is no tenderness.  Musculoskeletal: He exhibits no edema or tenderness.  Left elbow is not tender or inflamed (may have injured moving a water heater)  Lymphadenopathy:    He has no cervical adenopathy.  Psychiatric: He has a normal mood and affect. His behavior is normal.          Assessment & Plan:

## 2014-10-29 NOTE — Assessment & Plan Note (Signed)
Non obstructing stones Not sure if uric acid or calcium Will just have him increase fluids Consider K citrate

## 2014-10-29 NOTE — Assessment & Plan Note (Signed)
On allopurinol now Will stop the HCTZ Check labs next time

## 2014-10-29 NOTE — Progress Notes (Signed)
Pre visit review using our clinic review tool, if applicable. No additional management support is needed unless otherwise documented below in the visit note. 

## 2014-10-29 NOTE — Assessment & Plan Note (Signed)
BP Readings from Last 3 Encounters:  10/29/14 130/80  10/13/14 155/103  10/10/14 164/91   Repeat by me 166/100  Will stop HCTZ due to gout He will restart the carvedilol

## 2014-10-29 NOTE — Assessment & Plan Note (Signed)
Sounds like it is severe Fiancee notes sig symptoms Bad daytime somnolence Will set up with pulmonary

## 2015-01-11 ENCOUNTER — Encounter: Payer: Self-pay | Admitting: Internal Medicine

## 2015-01-11 ENCOUNTER — Ambulatory Visit (INDEPENDENT_AMBULATORY_CARE_PROVIDER_SITE_OTHER): Payer: Commercial Indemnity | Admitting: Internal Medicine

## 2015-01-11 VITALS — BP 128/80 | HR 81 | Temp 97.9°F | Wt 383.0 lb

## 2015-01-11 DIAGNOSIS — S93401S Sprain of unspecified ligament of right ankle, sequela: Secondary | ICD-10-CM

## 2015-01-11 DIAGNOSIS — I10 Essential (primary) hypertension: Secondary | ICD-10-CM

## 2015-01-11 DIAGNOSIS — S93409A Sprain of unspecified ligament of unspecified ankle, initial encounter: Secondary | ICD-10-CM | POA: Insufficient documentation

## 2015-01-11 NOTE — Progress Notes (Signed)
   Subjective:    Patient ID: Troy Rogers, male    DOB: [birth year], 39 y.o.   MRN: [id number]  HPI Here with fiancee  Missed 3 steps falling going out of house-- 3 nights ago Doesn't remember if he twisted ankle or not Was able to get right up---scratched up left leg (cleaning and watching carefully)  Went to church the next day and some pain Then worsened 2 nights ago--- had to go to ER [facility] [employer] [facility] Told he has just a strain  Didn't do any emergency care like ice Did try to stay off it Tried ibuprofen 600mg  once--may have helped some  Current Outpatient Prescriptions on File Prior to Visit  Medication Sig Dispense Refill  . allopurinol (ZYLOPRIM) 300 MG tablet Take 300 mg by mouth daily.    . carvedilol (COREG) 25 MG tablet Take 25 mg by mouth 2 (two) times daily with a meal.    . Cholecalciferol (VITAMIN D3) 5000 UNITS TABS Take 1 tablet by mouth daily.    Marland Kitchen. ibuprofen (ADVIL,MOTRIN) 800 MG tablet Take 1 tablet (800 mg total) by mouth 3 (three) times daily. 21 tablet 0  . lisinopril (PRINIVIL,ZESTRIL) 40 MG tablet Take 1 tablet (40 mg total) by mouth daily. 30 tablet 0  . ondansetron (ZOFRAN) 4 MG tablet Take 1 tablet (4 mg total) by mouth every 6 (six) hours. 12 tablet 0  . tamsulosin (FLOMAX) 0.4 MG CAPS capsule Take 1 capsule (0.4 mg total) by mouth daily. 14 capsule 0  . vitamin B-12 (CYANOCOBALAMIN) 500 MCG tablet Take 500 mcg by mouth daily.    . [DISCONTINUED] cloNIDine (CATAPRES - DOSED IN MG/24 HR) 0.2 mg/24hr patch Place 1 patch (0.2 mg total) onto the skin once a week. (Patient not taking: Reported on 10/08/2014) 4 patch 12   No current facility-administered medications on file prior to visit.    Allergies  Allergen Reactions  . Penicillins Hives    Past Medical History  Diagnosis Date  . Hypertension   . Nephrolithiasis   . Irritable bowel syndrome (IBS)   . Obstructive sleep apnea   . Gout     Past Surgical History  Procedure Laterality  Date  . Strabismus surgery  1983    Family History  Problem Relation Age of Onset  . Heart disease Mother   . COPD Mother   . Stroke Mother     TIA vs very mild  . Heart disease Father     History   Social History  . Marital Status: Divorced    Spouse Name: N/A  . Number of Children: 0  . Years of Education: N/A   Occupational History  . Apartment maintenance    Social History Main Topics  . Smoking status: Never Smoker   . Smokeless tobacco: Never Used  . Alcohol Use: Yes     Comment: ocassionally   . Drug Use: No  . Sexual Activity: Not on file   Other Topics Concern  . Not on file   Social History Narrative   Review of Systems  BP 181/109 at ER yesterday AM Some nausea since yesterday Appetite is off     Objective:   Physical Exam  Abdominal: Soft. There is no tenderness.  Musculoskeletal:  No evidence of joint inflammation Some tenderness over lateral ligaments distal to malleolus          Assessment & Plan:

## 2015-01-11 NOTE — Assessment & Plan Note (Signed)
BP Readings from Last 3 Encounters:  01/11/15 128/80  10/29/14 130/80  10/13/14 155/103   Repeat 1442/90 on right Acceptable control off the diuretic No change in meds for now

## 2015-01-11 NOTE — Progress Notes (Signed)
Pre visit review using our clinic review tool, if applicable. No additional management support is needed unless otherwise documented below in the visit note. 

## 2015-01-11 NOTE — Assessment & Plan Note (Signed)
Does not appear to be an exacerbation of gout ---since the twist during the fall Recommended he use the air cast he has--- and limit weight bearing Ibuprofen prn

## 2015-01-19 ENCOUNTER — Institutional Professional Consult (permissible substitution): Payer: No Typology Code available for payment source | Admitting: Internal Medicine

## 2015-01-24 ENCOUNTER — Encounter: Payer: Self-pay | Admitting: Internal Medicine

## 2015-01-24 ENCOUNTER — Ambulatory Visit (INDEPENDENT_AMBULATORY_CARE_PROVIDER_SITE_OTHER): Payer: Commercial Indemnity | Admitting: Internal Medicine

## 2015-01-24 VITALS — BP 144/80 | HR 85 | Ht 67.0 in | Wt 374.8 lb

## 2015-01-24 DIAGNOSIS — G4733 Obstructive sleep apnea (adult) (pediatric): Secondary | ICD-10-CM | POA: Diagnosis not present

## 2015-01-24 NOTE — Progress Notes (Signed)
01/24/15- 41 yoM never smoker referred courtesy of Dr Alphonsus Sias; falls asleep during the day; no sleep study. Pt wakes up gasping for air.  Steffanie Rainwater here     Epworth 18/24 He c/o difficulty initiating and maintaining sleep- mainly falls asleep too easily. Snores loudly, witnessed apneas. Gradually worse x 8 months. Non-positional. Occasionally has to pull over to nap while driving.  ENT- no surgery, no nasal fracture.  Some seasonal pollen rhinitis. Enlarged heart.   Prior to Admission medications   Medication Sig Start Date End Date Taking? Authorizing Provider  allopurinol (ZYLOPRIM) 300 MG tablet Take 300 mg by mouth daily.   Yes Historical Provider, MD  carvedilol (COREG) 25 MG tablet Take 25 mg by mouth 2 (two) times daily with a meal.   Yes Historical Provider, MD  ibuprofen (ADVIL,MOTRIN) 800 MG tablet Take 1 tablet (800 mg total) by mouth 3 (three) times daily. 10/08/14  Yes Glynn Octave, MD  lisinopril (PRINIVIL,ZESTRIL) 40 MG tablet Take 1 tablet (40 mg total) by mouth daily. 01/22/14  Yes Russella Dar, NP   Past Medical History  Diagnosis Date  . Hypertension   . Nephrolithiasis   . Irritable bowel syndrome (IBS)   . Obstructive sleep apnea   . Gout    Past Surgical History  Procedure Laterality Date  . Strabismus surgery  1983   Family History  Problem Relation Age of Onset  . Heart disease Mother   . COPD Mother   . Stroke Mother     TIA vs very mild  . COPD Father   . Heart attack Maternal Grandfather   . Heart attack Paternal Grandfather   . Cancer Maternal Grandmother     in throat  . Heart attack Maternal Grandmother   . Heart disease Paternal Grandmother    History   Social History  . Marital Status: Divorced    Spouse Name: N/A  . Number of Children: 0  . Years of Education: N/A   Occupational History  . Apartment maintenance    Social History Main Topics  . Smoking status: Never Smoker   . Smokeless tobacco: Never Used  . Alcohol Use: Yes   Comment: ocassionally   . Drug Use: No  . Sexual Activity: Not on file   Other Topics Concern  . Not on file   Social History Narrative   ROS-see HPI   Negative unless "+" Constitutional:    weight loss, night sweats, fevers, chills, fatigue, lassitude. HEENT:    +headaches, difficulty swallowing, tooth/dental problems, sore throat,       +sneezing, itching, ear ache, +nasal congestion, post nasal drip, snoring CV:    chest pain, orthopnea, PND, swelling in lower extremities, anasarca,                                                    dizziness, palpitations Resp:   +shortness of breath with exertion or at rest.                +productive cough,   non-productive cough, coughing up of blood.              change in color of mucus.  wheezing.   Skin:    rash or lesions. GI:  No-   Heartburn,+ indigestion, abdominal pain, nausea, vomiting, diarrhea,  change in bowel habits, loss of appetite GU: dysuria, change in color of urine, no urgency or frequency.   flank pain. MS:   +joint pain, stiffness, decreased range of motion, back pain. Neuro-     nothing unusual Psych:  change in mood or affect.  depression or anxiety.   memory loss.  OBJ- Physical Exam General- Alert, Oriented, Affect-appropriate, Distress- none acute, +morbidly obese Skin- rash-none, lesions- none, excoriation- none Lymphadenopathy- none Head- atraumatic            Eyes- Gross vision intact, PERRLA, conjunctivae and secretions clear            Ears- Hearing, canals-normal            Nose- Clear, no-Septal dev, mucus, polyps, erosion, perforation             Throat- Mallampati II , mucosa clear , drainage- none, tonsils- atrophic Neck- flexible , trachea midline, no stridor , thyroid nl, carotid no bruit Chest - symmetrical excursion , unlabored           Heart/CV- RRR , no murmur , no gallop  , no rub, nl s1 s2                           - JVD- none , edema- none, stasis changes- none, varices- none            Lung- clear to P&A, wheeze- none, cough- none , dullness-none, rub- none           Chest wall-  Abd-  Br/ Gen/ Rectal- Not done, not indicated Extrem- cyanosis- none, clubbing, none, atrophy- none, strength- nl Neuro- grossly intact to observation, +drowsy, +avoids eye contact

## 2015-01-24 NOTE — Patient Instructions (Signed)
Order- schedule split protocol NPSG       Dx OSA  Please call as needed 

## 2015-01-25 NOTE — Assessment & Plan Note (Signed)
Probable severe OSA, complicated by morbid obesity. We reviewed sleep hygiene, driving safety, basics of OSA and sleep evaluation. Plan- split study NPSG

## 2015-01-25 NOTE — Assessment & Plan Note (Signed)
Far over ideal body weight. Discussed how this impacts other health issues. May benefit from bariatric referral onc

## 2015-02-18 ENCOUNTER — Ambulatory Visit (HOSPITAL_BASED_OUTPATIENT_CLINIC_OR_DEPARTMENT_OTHER): Payer: Commercial Indemnity | Attending: Internal Medicine

## 2015-02-18 DIAGNOSIS — R0683 Snoring: Secondary | ICD-10-CM | POA: Insufficient documentation

## 2015-02-18 DIAGNOSIS — G4733 Obstructive sleep apnea (adult) (pediatric): Secondary | ICD-10-CM | POA: Diagnosis not present

## 2015-02-20 DIAGNOSIS — G4733 Obstructive sleep apnea (adult) (pediatric): Secondary | ICD-10-CM | POA: Diagnosis not present

## 2015-02-20 NOTE — Progress Notes (Signed)
Patient Name: Troy Rogers, Troy Rogers Date: 02/18/2015 Gender: Male D.O.B: Jun 14, 1976 Age (years): 41 Referring Provider: Baird Lyons MD, ABSM Height (inches): 67 Interpreting Physician: Baird Lyons MD, ABSM Weight (lbs): 384 RPSGT: Baxter Flattery BMI: 60 MRN: [id number] Neck Size: 19.00 CLINICAL INFORMATION Sleep Study Type: Split Night CPAP  Indication for sleep study: Excessive Daytime Sleepiness, Fatigue, Hypertension, Obesity, OSA, Snoring, Witnessed Apneas  Epworth Sleepiness Score: 17  SLEEP STUDY TECHNIQUE As per the AASM Manual for the Scoring of Sleep and Associated Events v2.3 (April 2016) with a hypopnea requiring 4% desaturations.  The channels recorded and monitored were frontal, central and occipital EEG, electrooculogram (EOG), submentalis EMG (chin), nasal and oral airflow, thoracic and abdominal wall motion, anterior tibialis EMG, snore microphone, electrocardiogram, and pulse oximetry. Continuous positive airway pressure (CPAP) was initiated when the patient met split night criteria and was titrated according to treat sleep-disordered breathing.  MEDICATIONS Medications taken by the patient : charted for review Medications administered by patient during sleep study : No sleep medicine administered.  RESPIRATORY PARAMETERS Diagnostic  Total AHI (/hr): 66.4 RDI (/hr): 66.4 OA Index (/hr): 30.1 CA Index (/hr): 3.3 REM AHI (/hr): N/A NREM AHI (/hr): 66.4 Supine AHI (/hr): 84.1 Non-supine AHI (/hr): 45.05 Min O2 Sat (%): 79.00 Mean O2 (%): 92.37 Time below 88% (min): 11.2   Titration  Optimal Pressure (cm): 10 AHI at Optimal Pressure (/hr): 0.0 Min O2 at Optimal Pressure (%): 93.0 Supine % at Optimal (%): 0 Sleep % at Optimal (%): 100    SLEEP ARCHITECTURE The recording time for the entire night was 404.7 minutes.  During a baseline period of 188.8 minutes, the patient slept for 161.7 minutes in REM and nonREM, yielding a sleep efficiency of 85.7%. Sleep  onset after lights out was 17.1 minutes with a REM latency of N/A minutes. The patient spent 7.11% of the night in stage N1 sleep, 91.34% in stage N2 sleep, 1.55% in stage N3 and 0.00% in REM.  During the titration period of 209.5 minutes, the patient slept for 197.6 minutes in REM and nonREM, yielding a sleep efficiency of 94.3%. Sleep onset after CPAP initiation was 6.9 minutes with a REM latency of 23.5 minutes. The patient spent 4.30% of the night in stage N1 sleep, 70.39% in stage N2 sleep, 0.00% in stage N3 and 25.30% in REM.  CARDIAC DATA The 2 lead EKG demonstrated sinus rhythm. The mean heart rate was 52.22 beats per minute. Other EKG findings include: None.  LEG MOVEMENT DATA The total Periodic Limb Movements of Sleep (PLMS) were 39. The PLMS index was 6.50 .  IMPRESSIONS Severe obstructive sleep apnea occurred during the diagnostic portion of the study (AHI = 66.4/hour). An optimal PAP pressure was selected for this patient ( 10 cm of water) No significant central sleep apnea occurred during the diagnostic portion of the study (CAI = 3.3/hour). Moderate oxygen desaturation was noted during the diagnostic portion of the study (Min O2 =79.00%). The patient snored with Loud snoring volume during the diagnostic portion of the study. No cardiac abnormalities were noted during this study. Mild periodic limb movements of sleep occurred during the study.  DIAGNOSIS Obstructive Sleep Apnea (327.23 [G47.33 ICD-10])  RECOMMENDATIONS Trial of CPAP therapy on 10 cm H2O with a Medium size Fisher&Paykel Full Face Mask Simplus mask and heated humidification. Avoid alcohol, sedatives and other CNS depressants that may worsen sleep apnea and disrupt normal sleep architecture. Sleep hygiene should be reviewed to assess factors that may improve sleep quality.  Weight management and regular exercise should be initiated or continued.    Deneise Lever Diplomate, American Board of Sleep  Medicine  ELECTRONICALLY SIGNED ON:  02/20/2015, 11:25 AM [place] PH: [phone]   FX: [phone] [place]

## 2015-03-03 ENCOUNTER — Encounter: Payer: Self-pay | Admitting: Internal Medicine

## 2015-03-03 ENCOUNTER — Ambulatory Visit (INDEPENDENT_AMBULATORY_CARE_PROVIDER_SITE_OTHER): Payer: Commercial Indemnity | Admitting: Internal Medicine

## 2015-03-03 VITALS — BP 150/90 | HR 80 | Ht 67.0 in | Wt 377.6 lb

## 2015-03-03 DIAGNOSIS — G4733 Obstructive sleep apnea (adult) (pediatric): Secondary | ICD-10-CM

## 2015-03-03 NOTE — Assessment & Plan Note (Signed)
Reviewed importance of serious effort to lose weight

## 2015-03-03 NOTE — Assessment & Plan Note (Signed)
Medical concerns, treatment options, his responsibility to drive safely all reviewed Plan-begin CPAP with auto

## 2015-03-03 NOTE — Progress Notes (Signed)
01/24/15- 78 yoM never smoker referred courtesy of Dr Alphonsus Sias; falls asleep during the day; no sleep study. Pt wakes up gasping for air.  Troy Rogers here     Epworth 18/24 He c/o difficulty initiating and maintaining sleep- mainly falls asleep too easily. Snores loudly, witnessed apneas. Gradually worse x 8 months. Non-positional. Occasionally has to pull over to nap while driving.  ENT- no surgery, no nasal fracture.  Some seasonal pollen rhinitis. Enlarged heart.   03/03/15-  43 yoM never smoker referred courtesy of Dr Alphonsus Sias; falls asleep during the day; no sleep study. Pt wakes up gasping for air.  FOLLOWS ZOX:WRUEAV sleep study with pt. NPSG 02/18/15-  Severe OSA, AHI 66.4/ hr, desat to 79%, CPAP titrated to 10,     Wt 384 lbs Results reviewed and treatment options discussed.  ROS-see HPI   Negative unless "+" Constitutional:    weight loss, night sweats, fevers, chills, fatigue, lassitude. HEENT:    +headaches, difficulty swallowing, tooth/dental problems, sore throat,       +sneezing, itching, ear ache, +nasal congestion, post nasal drip, snoring CV:    chest pain, orthopnea, PND, swelling in lower extremities, anasarca,                                                    dizziness, palpitations Resp:   +shortness of breath with exertion or at rest.                +productive cough,   non-productive cough, coughing up of blood.              change in color of mucus.  wheezing.   Skin:    rash or lesions. GI:  No-   Heartburn,+ indigestion, abdominal pain, nausea, vomiting, diarrhea,                 change in bowel habits, loss of appetite GU: dysuria, change in color of urine, no urgency or frequency.   flank pain. MS:   +joint pain, stiffness, decreased range of motion, back pain. Neuro-     nothing unusual Psych:  change in mood or affect.  depression or anxiety.   memory loss.  OBJ- Physical Exam General- Alert, Oriented, Affect-appropriate, Distress- none acute, +morbidly obese Skin-  rash-none, lesions- none, excoriation- none Lymphadenopathy- none Head- atraumatic            Eyes- Gross vision intact, PERRLA, conjunctivae and secretions clear            Ears- Hearing, canals-normal            Nose- Clear, no-Septal dev, mucus, polyps, erosion, perforation             Throat- Mallampati II , mucosa clear , drainage- none, tonsils- atrophic Neck- flexible , trachea midline, no stridor , thyroid nl, carotid no bruit Chest - symmetrical excursion , unlabored           Heart/CV- RRR , no murmur , no gallop  , no rub, nl s1 s2                           - JVD- none , edema- none, stasis changes- none, varices- none           Lung- clear to P&A, wheeze- none, cough-  none , dullness-none, rub- none           Chest wall-  Abd-  Br/ Gen/ Rectal- Not done, not indicated Extrem- cyanosis- none, clubbing, none, atrophy- none, strength- nl Neuro- grossly intact to observation,  +avoids eye contact

## 2015-03-03 NOTE — Patient Instructions (Signed)
Order- new DME, new CPAP auto 5-15, mask of choice, humidifier, supplies, AirView    Dx OSA   Please call the DME company or [place] as needed

## 2015-03-04 ENCOUNTER — Ambulatory Visit (INDEPENDENT_AMBULATORY_CARE_PROVIDER_SITE_OTHER): Payer: Commercial Indemnity

## 2015-03-04 ENCOUNTER — Ambulatory Visit (INDEPENDENT_AMBULATORY_CARE_PROVIDER_SITE_OTHER): Payer: Commercial Indemnity | Admitting: Family Medicine

## 2015-03-04 VITALS — BP 132/88 | HR 90 | Temp 98.6°F | Resp 16 | Ht 67.0 in | Wt 377.0 lb

## 2015-03-04 DIAGNOSIS — R05 Cough: Secondary | ICD-10-CM | POA: Diagnosis not present

## 2015-03-04 DIAGNOSIS — R059 Cough, unspecified: Secondary | ICD-10-CM

## 2015-03-04 MED ORDER — LEVOFLOXACIN 500 MG PO TABS: 500.0000 mg | ORAL_TABLET | Freq: Every day | ORAL | Status: DC

## 2015-03-04 NOTE — Patient Instructions (Signed)
We are going to treat you with levaquin for your lung infection However if you are not feeling better in the next couple of days please call or or come in- if you have any acute worsening go to the ER

## 2015-03-04 NOTE — Progress Notes (Signed)
[facility] [address], [place] [address] [phone]  Date:  03/04/2015   Name:  Troy Rogers   DOB:  [birth year]   MRN:  [id number]  PCP:  Tillman Abide, MD    Chief Complaint: Cough   History of Present Illness:  Troy Rogers is a 39 y.o. very pleasant male patient who presents with the following:  Established pt with history of obesity, HTN here today with complaint of illness.  He notes a slight cough this am, seems to be getting worse as the day goes on.  It is a "deep chest cough," he has not noted a fever.   He has noted some sweats and chills.  Yesterday he did not feel that well.   No vomiting but he has nearly vomited from cough He has noted a little SOB when he coughed today  He did take some tylenol last night only- nothing so far today  Never had a DVT or PE, no hemoptysis, no long trips, no operations   Patient Active Problem List   Diagnosis Date Noted  . Sprain of ankle 01/11/2015  . Nephrolithiasis   . Obstructive sleep apnea   . Gout   . Essential hypertension 10/13/2014  . Morbid obesity 10/13/2014  . Irritable bowel syndrome (IBS)   . Chest pain 01/21/2014    Past Medical History  Diagnosis Date  . Hypertension   . Nephrolithiasis   . Irritable bowel syndrome (IBS)   . Obstructive sleep apnea   . Gout     Past Surgical History  Procedure Laterality Date  . Strabismus surgery  1983    Social History  Substance Use Topics  . Smoking status: Never Smoker   . Smokeless tobacco: Never Used  . Alcohol Use: Yes     Comment: ocassionally     Family History  Problem Relation Age of Onset  . Heart disease Mother   . COPD Mother   . Stroke Mother     TIA vs very mild  . COPD Father   . Heart attack Maternal Grandfather   . Heart attack Paternal Grandfather   . Cancer Maternal Grandmother     in throat  . Heart attack Maternal Grandmother   . Heart disease Paternal Grandmother     Allergies  Allergen  Reactions  . Penicillins Hives    Medication list has been reviewed and updated.  Current Outpatient Prescriptions on File Prior to Visit  Medication Sig Dispense Refill  . allopurinol (ZYLOPRIM) 300 MG tablet Take 300 mg by mouth daily.    . carvedilol (COREG) 25 MG tablet Take 25 mg by mouth 2 (two) times daily with a meal.    . lisinopril (PRINIVIL,ZESTRIL) 40 MG tablet Take 1 tablet (40 mg total) by mouth daily. 30 tablet 0  . [DISCONTINUED] cloNIDine (CATAPRES - DOSED IN MG/24 HR) 0.2 mg/24hr patch Place 1 patch (0.2 mg total) onto the skin once a week. (Patient not taking: Reported on 10/08/2014) 4 patch 12   No current facility-administered medications on file prior to visit.    Review of Systems:  As per HPI- otherwise negative.   Physical Examination: Filed Vitals:   03/04/15 1737  BP: 132/88  Pulse: 108  Temp: 98.6 F (37 C)  Resp: 16   Filed Vitals:   03/04/15 1737  Height: 5\' 7"  (1.702 m)  Weight: 377 lb (171.006 kg)   Body mass index is 59.03 kg/(m^2). Ideal Body Weight: Weight  in (lb) to have BMI = 25: 159.3  GEN: WDWN, NAD, Non-toxic, A & O x 3 HEENT: Atraumatic, Normocephalic. Neck supple. No masses, No LAD. Ears and Nose: No external deformity. CV: RRR, No M/G/R. No JVD. No thrill. No extra heart sounds. PULM: CTA B, no wheezes, crackles, rhonchi. No retractions. No resp. distress. No accessory muscle use. ABD: S, NT, ND, +BS. No rebound. No HSM. EXTR: No c/c/e NEURO Normal gait.  PSYCH: Normally interactive. Conversant. Not depressed or anxious appearing.  Calm demeanor.   [facility] reading (PRIMARY) by  Dr. Patsy Lager. CXR: negative  Assessment and Plan: Cough - Plan: DG Chest 2 View, levofloxacin (LEVAQUIN) 500 MG tablet  Treat for cough and malaise with levaquin- he is quite obese and prone to severe respiratory illness per his report Pt had tachycardia that resolved on recheck and VS otherwise normal-  do not suspect PE but advised him to seek care  if he is not getting better- right away if worse  Signed Abbe Amsterdam, MD

## 2015-03-18 ENCOUNTER — Telehealth: Payer: Self-pay | Admitting: Internal Medicine

## 2015-03-18 NOTE — Telephone Encounter (Signed)
Patient has not received his CPAP machine.  Patient says he has not heard anything from [place] about his machine and wants to know an update about it.  [facility] - can you follow this?

## 2015-03-18 NOTE — Telephone Encounter (Signed)
Called [name] and spoke with Troy Rogers.  She confirmed receipt of the patient's CPAP Order but cannot see anything in her system as to why patient has not received his machine yet.  [name] is going to put in a message for someone at [facility] to check on this Order. I called and spoke with the patient and advised him what [name] had told me.  I advised him if he did not hear from [employer] by early afternoon on Monday , to contact us again.  Patient voiced understanding.

## 2015-03-24 ENCOUNTER — Telehealth: Payer: Self-pay | Admitting: Pulmonary Disease

## 2015-03-24 ENCOUNTER — Telehealth: Payer: Self-pay | Admitting: Internal Medicine

## 2015-03-24 NOTE — Telephone Encounter (Signed)
[facility]ord [facility] they are letting me do a retro precert so when i get the auth i will let you know Tobe Sos

## 2015-03-24 NOTE — Telephone Encounter (Signed)
Error

## 2015-03-29 ENCOUNTER — Telehealth: Payer: Self-pay | Admitting: Internal Medicine

## 2015-03-29 DIAGNOSIS — G4733 Obstructive sleep apnea (adult) (pediatric): Secondary | ICD-10-CM

## 2015-03-29 MED ORDER — AZITHROMYCIN 250 MG PO TABS: ORAL_TABLET | ORAL | Status: AC

## 2015-03-29 NOTE — Telephone Encounter (Signed)
Offer Z pak and Mucinex-DM.            Order- DME reduce auto pap range to 5-15     Dx OSA

## 2015-03-29 NOTE — Telephone Encounter (Signed)
Spoke with pt, states he's had his cpap since last Tuesday.  Since Saturday, c/o chest tightness, prod cough with green/white tinged with mucus. Present all day but worse when lying down with cpap.  Denies fever. Pt has taken otc expectorant this morning only.   Pt uses [facility] on [address].   Pt requesting recs.  CY please advise.  Thanks!  Last ov: 03/03/15 Next ov: none.  Pt cancelled 05/11/15 ov.   Allergies  Allergen Reactions  . Penicillins Hives   Current Outpatient Prescriptions on File Prior to Visit  Medication Sig Dispense Refill  . acetaminophen (TYLENOL) 500 MG tablet Take 500 mg by mouth every 6 (six) hours as needed.    Marland Kitchen allopurinol (ZYLOPRIM) 300 MG tablet Take 300 mg by mouth daily.    . carvedilol (COREG) 25 MG tablet Take 25 mg by mouth 2 (two) times daily with a meal.    . levofloxacin (LEVAQUIN) 500 MG tablet Take 1 tablet (500 mg total) by mouth daily. 7 tablet 0  . lisinopril (PRINIVIL,ZESTRIL) 40 MG tablet Take 1 tablet (40 mg total) by mouth daily. 30 tablet 0  . [DISCONTINUED] cloNIDine (CATAPRES - DOSED IN MG/24 HR) 0.2 mg/24hr patch Place 1 patch (0.2 mg total) onto the skin once a week. (Patient not taking: Reported on 10/08/2014) 4 patch 12   No current facility-administered medications on file prior to visit.

## 2015-03-29 NOTE — Telephone Encounter (Signed)
Spoke with pt. He is aware of CY's recommendation. Rx and order have been placed. Nothing further was needed.

## 2015-04-15 ENCOUNTER — Encounter (HOSPITAL_BASED_OUTPATIENT_CLINIC_OR_DEPARTMENT_OTHER): Payer: Commercial Indemnity

## 2015-04-25 ENCOUNTER — Ambulatory Visit: Payer: Commercial Indemnity | Admitting: Internal Medicine

## 2015-04-26 ENCOUNTER — Telehealth: Payer: Self-pay | Admitting: Internal Medicine

## 2015-04-26 NOTE — Telephone Encounter (Signed)
I reviewed cardiac status and patient's perceived ability to manage home sleep study as bases for choosing split study. Med Dir doubtful [employer]ann [employer] will approve.

## 2015-04-27 ENCOUNTER — Ambulatory Visit (INDEPENDENT_AMBULATORY_CARE_PROVIDER_SITE_OTHER): Payer: Commercial Indemnity | Admitting: Internal Medicine

## 2015-04-27 ENCOUNTER — Encounter: Payer: Self-pay | Admitting: Internal Medicine

## 2015-04-27 VITALS — BP 160/100 | HR 88 | Temp 97.5°F | Wt 382.0 lb

## 2015-04-27 DIAGNOSIS — M5416 Radiculopathy, lumbar region: Secondary | ICD-10-CM | POA: Diagnosis not present

## 2015-04-27 DIAGNOSIS — I10 Essential (primary) hypertension: Secondary | ICD-10-CM | POA: Diagnosis not present

## 2015-04-27 HISTORY — DX: Radiculopathy, lumbar region: M54.16

## 2015-04-27 MED ORDER — LISINOPRIL 40 MG PO TABS
40.0000 mg | ORAL_TABLET | Freq: Every day | ORAL | Status: DC
Start: 2015-04-27 — End: 2016-04-25

## 2015-04-27 MED ORDER — FUROSEMIDE 40 MG PO TABS: 40.0000 mg | ORAL_TABLET | Freq: Every day | ORAL | Status: DC | PRN

## 2015-04-27 MED ORDER — LISINOPRIL 40 MG PO TABS: 40.0000 mg | ORAL_TABLET | Freq: Every day | ORAL | Status: DC

## 2015-04-27 NOTE — Assessment & Plan Note (Signed)
Pursuing MRI and possible surgery due to persistent pain

## 2015-04-27 NOTE — Assessment & Plan Note (Signed)
BP Readings from Last 3 Encounters:  04/27/15 160/100  03/04/15 132/88  03/03/15 150/90   Repeat 148 systolic on right Should be fine back on lisinopril Will give furosemide for prn for the edema

## 2015-04-27 NOTE — Progress Notes (Signed)
Subjective:    Patient ID: Troy Rogers, male    DOB: 1975/12/19, 39 y.o.   MRN: [id number]  HPI Here for follow up of back and HTN  Reviewed neurosurgical note Pain is actually worse--at first in right leg--now some symptoms in left leg Still working--but has had to have some days off at times due to the pain Hard to sit in car and drive and increased pain with walking around Has MRI scheduled--may need discectomy Some leg weakness also--mostly on right  Has been consistent with BP meds but ran out of lisinopril about 2 weeks ago No significant headaches--- just mild "normal" headaches No chest pain No SOB  Current Outpatient Prescriptions on File Prior to Visit  Medication Sig Dispense Refill  . acetaminophen (TYLENOL) 500 MG tablet Take 500 mg by mouth every 6 (six) hours as needed.    Marland Kitchen. allopurinol (ZYLOPRIM) 300 MG tablet Take 300 mg by mouth daily.    . carvedilol (COREG) 25 MG tablet Take 25 mg by mouth 2 (two) times daily with a meal.    . lisinopril (PRINIVIL,ZESTRIL) 40 MG tablet Take 1 tablet (40 mg total) by mouth daily. 30 tablet 0  . [DISCONTINUED] cloNIDine (CATAPRES - DOSED IN MG/24 HR) 0.2 mg/24hr patch Place 1 patch (0.2 mg total) onto the skin once a week. (Patient not taking: Reported on 10/08/2014) 4 patch 12   No current facility-administered medications on file prior to visit.    Allergies  Allergen Reactions  . Penicillins Hives    Past Medical History  Diagnosis Date  . Hypertension   . Nephrolithiasis   . Irritable bowel syndrome (IBS)   . Obstructive sleep apnea   . Gout     Past Surgical History  Procedure Laterality Date  . Strabismus surgery  1983    Family History  Problem Relation Age of Onset  . Heart disease Mother   . COPD Mother   . Stroke Mother     TIA vs very mild  . COPD Father   . Heart attack Maternal Grandfather   . Heart attack Paternal Grandfather   . Cancer Maternal Grandmother     in throat  . Heart attack  Maternal Grandmother   . Heart disease Paternal Grandmother     Social History   Social History  . Marital Status: Divorced    Spouse Name: N/A  . Number of Children: 0  . Years of Education: N/A   Occupational History  . Apartment maintenance    Social History Main Topics  . Smoking status: Never Smoker   . Smokeless tobacco: Never Used  . Alcohol Use: Yes     Comment: ocassionally   . Drug Use: No  . Sexual Activity: Not on file   Other Topics Concern  . Not on file   Social History Narrative   Review of Systems  Sleeps well with the CPAP Appetite is off with the pain      Objective:   Physical Exam  Constitutional: He appears well-developed.  Mildly uncomfortable due to back pain  Neck: Normal range of motion. Neck supple. No thyromegaly present.  Cardiovascular: Normal rate, regular rhythm and normal heart sounds.  Exam reveals no gallop.   No murmur heard. Pulmonary/Chest: Effort normal and breath sounds normal. No respiratory distress. He has no wheezes. He has no rales.  Musculoskeletal:  2+ tense calf edema with slight tenderness  Lymphadenopathy:    He has no cervical adenopathy.  Psychiatric: He  has a normal mood and affect. His behavior is normal.          Assessment & Plan:

## 2015-04-27 NOTE — Progress Notes (Signed)
Pre visit review using our clinic review tool, if applicable. No additional management support is needed unless otherwise documented below in the visit note. 

## 2015-05-11 ENCOUNTER — Ambulatory Visit: Payer: Commercial Indemnity | Admitting: Internal Medicine

## 2015-06-01 ENCOUNTER — Encounter: Payer: Self-pay | Admitting: Internal Medicine

## 2015-06-02 ENCOUNTER — Other Ambulatory Visit: Payer: Self-pay | Admitting: Neurological Surgery

## 2015-06-02 DIAGNOSIS — M5127 Other intervertebral disc displacement, lumbosacral region: Secondary | ICD-10-CM

## 2015-06-11 ENCOUNTER — Ambulatory Visit
Admission: RE | Admit: 2015-06-11 | Discharge: 2015-06-11 | Disposition: A | Discharge status: Home / Self Care | Payer: Managed Care, Other (non HMO) | Source: Ambulatory Visit | Attending: Neurological Surgery | Admitting: Neurological Surgery

## 2015-06-11 DIAGNOSIS — M5127 Other intervertebral disc displacement, lumbosacral region: Secondary | ICD-10-CM

## 2015-06-24 ENCOUNTER — Other Ambulatory Visit: Payer: Self-pay | Admitting: Neurological Surgery

## 2015-06-26 HISTORY — PX: OTHER SURGICAL HISTORY: SHX169

## 2015-06-28 ENCOUNTER — Ambulatory Visit: Payer: Managed Care, Other (non HMO) | Admitting: Internal Medicine

## 2015-07-12 ENCOUNTER — Telehealth: Payer: Self-pay | Admitting: Internal Medicine

## 2015-07-12 ENCOUNTER — Encounter (HOSPITAL_COMMUNITY): Payer: Self-pay

## 2015-07-12 ENCOUNTER — Encounter (HOSPITAL_COMMUNITY)
Admission: RE | Admit: 2015-07-12 | Discharge: 2015-07-12 | Disposition: A | Discharge status: Home / Self Care | Payer: Managed Care, Other (non HMO) | Source: Ambulatory Visit | Attending: Neurological Surgery | Admitting: Neurological Surgery

## 2015-07-12 DIAGNOSIS — I10 Essential (primary) hypertension: Secondary | ICD-10-CM | POA: Insufficient documentation

## 2015-07-12 DIAGNOSIS — Z0183 Encounter for blood typing: Secondary | ICD-10-CM | POA: Diagnosis not present

## 2015-07-12 DIAGNOSIS — M109 Gout, unspecified: Secondary | ICD-10-CM | POA: Diagnosis not present

## 2015-07-12 DIAGNOSIS — Z01812 Encounter for preprocedural laboratory examination: Secondary | ICD-10-CM | POA: Insufficient documentation

## 2015-07-12 DIAGNOSIS — Z01818 Encounter for other preprocedural examination: Secondary | ICD-10-CM | POA: Insufficient documentation

## 2015-07-12 DIAGNOSIS — G4733 Obstructive sleep apnea (adult) (pediatric): Secondary | ICD-10-CM | POA: Diagnosis not present

## 2015-07-12 DIAGNOSIS — R9431 Abnormal electrocardiogram [ECG] [EKG]: Secondary | ICD-10-CM | POA: Diagnosis not present

## 2015-07-12 DIAGNOSIS — K589 Irritable bowel syndrome without diarrhea: Secondary | ICD-10-CM | POA: Insufficient documentation

## 2015-07-12 DIAGNOSIS — Z79899 Other long term (current) drug therapy: Secondary | ICD-10-CM | POA: Diagnosis not present

## 2015-07-12 HISTORY — DX: Cellulitis, unspecified: L03.90

## 2015-07-12 HISTORY — DX: Headache: R51

## 2015-07-12 HISTORY — DX: Headache, unspecified: R51.9

## 2015-07-12 LAB — CBC
HCT: 44.3 % (ref 39.0–52.0)
HEMOGLOBIN: 14.7 g/dL (ref 13.0–17.0)
MCH: 30.4 pg (ref 26.0–34.0)
MCHC: 33.2 g/dL (ref 30.0–36.0)
MCV: 91.7 fL (ref 78.0–100.0)
Platelets: 268 10*3/uL (ref 150–400)
RBC: 4.83 MIL/uL (ref 4.22–5.81)
RDW: 13.8 % (ref 11.5–15.5)
WBC: 12.4 10*3/uL — ABNORMAL HIGH (ref 4.0–10.5)

## 2015-07-12 LAB — SURGICAL PCR SCREEN
MRSA, PCR: NEGATIVE
Staphylococcus aureus: NEGATIVE

## 2015-07-12 LAB — TYPE AND SCREEN
ABO/RH(D): O POS
Antibody Screen: NEGATIVE

## 2015-07-12 LAB — ABO/RH: ABO/RH(D): O POS

## 2015-07-12 NOTE — Progress Notes (Addendum)
Anesthesia Note: Patient is a 40 year old male scheduled for L4-5 PLIF on 07/19/15 by Dr. Danielle Dess.  History includes HTN (diagnosed at age 68), edema, non-smoker, OSA with CPAP use, migraines, gout, IBS, nephrolithiasis, strabismus surgery '83. BMI is 60.91 consistent with super morbid obesity.   PCP is Dr. Alphonsus Sias. He is not routinely followed by a cardiologist, but he was seen by [facility] (Dr. Patty Sermons, Dr. Clifton James) during a 12/2013 admission for atypical chest pain with hemoptysis in which he ruled out for MI. CTA was negative for PE, cannot rule out pneumonitis. Echo was done showed normal LVEF and no further cardiac work-up was recommended. He was treated for community acquired pneumonia. Pulmonologist is Dr. Jetty Duhamel.  Meds include albuterol, allopurinol, Coreg 25 BID, colchicine, Lasix 40 mg daily as needed, lisinopril 40 mg daily, Percocet.  PAT Vitals: BP 190/113, 194/108 (our largest cuff was used), HR 63, RR 20, T 37.2, O2 sat 95%. He denied chest pain, SOB, headaches.   07/12/15 EKG: NSR, cannot rule out anterior infarct (age undetermined). EKG appears similar to 11/24/13 tracing.   01/21/14 Echo: Study Conclusions - Left ventricle: The cavity size was normal. There was mild concentric hypertrophy. Systolic function was normal. The estimated ejection fraction was in the range of 55% to 60%. Although no diagnostic regional wall motion abnormality was identified, this possibility cannot be completely excluded on the basis of this study. Doppler parameters are consistent with abnormal left ventricular relaxation (grade 1 diastolic dysfunction). - Left atrium: The atrium was moderately dilated.  03/04/15 CXR: IMPRESSION: No active cardiopulmonary disease. Stable exam.  02/18/15 NPSG: Severe OSA, AHI 66.4/ hr, desat to 79%, CPAP titrated to 10. Wt 384 lbs.  Labs are still pending.  I spoke with patient briefly at PAT. He was in a lot of pain when I saw him after  his EKG (said he turned the wrong way). We discussed that I had reviewed cardiology records and his BP results with anesthesiologist Dr. Maple Hudson. Patient will need to be re-evaluated by his PCP for HTN management prior to surgery.   Preoperative labs are pending. Will follow-up results tomorrow.  Velna Ochs [facility] Short Stay Center/Anesthesiology Phone [phone] 07/12/2015 5:38 PM  Addendum: When I went to follow-up on yesterday's labs it was noted that the BMET was not done. It was released and "in process" late yesterday afternoon but apparently cancelled by lab this morning (specimen clotted ?). He would need this done prior to surgery. I have contacted Dr. Karle Starch office to see if he is willing to have his staff draw this labs while at his HTN follow-up appointment on 07/15/15. I have notified Jessica at Dr. Verlee Rossetti office updating her of uncontrolled BP at PAT with plans for PCP follow-up this week.  Velna Ochs [facility] Short Stay Center/Anesthesiology Phone [phone] 07/13/2015 9:32 AM  Addendum: Patient was seen by Dr. Alphonsus Sias today for follow-up HTN. BP today was 158/92. He did think pain was likely a contributing factor. He did add diltiazem with plans to re-evaluate in 3 months and start weaning meds if BP is better by then. He felt patient should be okay to proceed with surgery.  Renal function panel today was unremarkable. Cr 0.98. Glucose 119.  If no acute changes and BP acceptable on the day of surgery then I anticipate that he can proceed as planned.  Velna Ochs [facility] Short Stay Center/Anesthesiology Phone [phone] 07/15/2015 6:26 PM

## 2015-07-12 NOTE — Progress Notes (Signed)
Spoke to [place] about patient's BP 190/113- 194/108, patient denies any chest pain or sob.  Patient states he took his BP meds today but ever since his back trouble his BP has been elevated.  Revonda Standard also aware of patient stating he has enlarged heart and take lasix for swelling in legs/ ankles- which he did have swelling yesterday to legs / ankles.  Revonda Standard asked that patient see Dr. Alphonsus Sias PCP to check BP.

## 2015-07-12 NOTE — Telephone Encounter (Signed)
Pt fiance called. Pt just finished with pre-admitting testing for upcoming surgery on 07/19/15. That office told pt he needs to see Dr. Alphonsus Sias asap or before his surgery to address  his bp being so high or change medication. He has another doctor appt tomorrow morning at 10am in [place] and will be there for at least 2 hours. Where would you like me to put him in you schedule? The rest of your week are same day appointment? Please advise.   [phone]

## 2015-07-12 NOTE — Pre-Procedure Instructions (Signed)
Troy Rogers  07/12/2015      [facility] [address] [place], [place] - [address] AT [facility] OF [facility] & [address] [place] [address] Phone: [phone] Fax: [phone]  [facility] [address] [name], [place] - [address] AT [facility] OF [address] & [address] [place] [address] Phone: [phone] Fax: [phone]    Your procedure is scheduled on  Tuesday  07/19/15  Report to [facility] Admitting at 930 A.M.  Call this number if you have problems the morning of surgery:  [phone]   Remember:  Do not eat food or drink liquids after midnight.  Take these medicines the morning of surgery with A SIP OF WATER   Albuterol inhaler, allopurinol, carvedilol (coreg), oxycodone if needed   Do not wear jewelry, make-up or nail polish.  Do not wear lotions, powders, or perfumes.  You may wear deodorant.  Do not shave 48 hours prior to surgery.  Men may shave face and neck.  Do not bring valuables to the hospital.  [facility] is not responsible for any belongings or valuables.  Contacts, dentures or bridgework may not be worn into surgery.  Leave your suitcase in the car.  After surgery it may be brought to your room.  For patients admitted to the hospital, discharge time will be determined by your treatment team.  Patients discharged the day of surgery will not be allowed to drive home.   Name and phone number of your driver:   Special instructions:  [facility] - Preparing for Surgery  Before surgery, you can play an important role.  Because skin is not sterile, your skin needs to be as free of germs as possible.  You can reduce the number of germs on you skin by washing with CHG (chlorahexidine gluconate) soap before surgery.  CHG is an antiseptic cleaner which kills germs and bonds with the skin to continue killing germs even after washing.  Please DO NOT use if you have an allergy to  CHG or antibacterial soaps.  If your skin becomes reddened/irritated stop using the CHG and inform your nurse when you arrive at Short Stay.  Do not shave (including legs and underarms) for at least 48 hours prior to the first CHG shower.  You may shave your face.  Please follow these instructions carefully:   1.  Shower with CHG Soap the night before surgery and the                                morning of Surgery.  2.  If you choose to wash your hair, wash your hair first as usual with your       normal shampoo.  3.  After you shampoo, rinse your hair and body thoroughly to remove the                      Shampoo.  4.  Use CHG as you would any other liquid soap.  You can apply chg directly       to the skin and wash gently with scrungie or a clean washcloth.  5.  Apply the CHG Soap to your body ONLY FROM THE NECK DOWN.        Do not use on open wounds or open sores.  Avoid contact with your  eyes,       ears, mouth and genitals (private parts).  Wash genitals (private parts)       with your normal soap.  6.  Wash thoroughly, paying special attention to the area where your surgery        will be performed.  7.  Thoroughly rinse your body with warm water from the neck down.  8.  DO NOT shower/wash with your normal soap after using and rinsing off       the CHG Soap.  9.  Pat yourself dry with a clean towel.            10.  Wear clean pajamas.            11.  Place clean sheets on your bed the night of your first shower and do not        sleep with pets.  Day of Surgery  Do not apply any lotions/deoderants the morning of surgery.  Please wear clean clothes to the hospital/surgery center.    Please read over the following fact sheets that you were given. Pain Booklet, Coughing and Deep Breathing, Blood Transfusion Information, MRSA Information and Surgical Site Infection Prevention

## 2015-07-13 ENCOUNTER — Telehealth: Payer: Self-pay | Admitting: Internal Medicine

## 2015-07-13 NOTE — Telephone Encounter (Signed)
Coming in on Friday Will do the lab then  Please cancel next week's visit though

## 2015-07-13 NOTE — Telephone Encounter (Signed)
Go ahead and add him on one of my same days later this week at a time he can make

## 2015-07-13 NOTE — Telephone Encounter (Signed)
Scheduled for 07/15/15

## 2015-07-13 NOTE — Telephone Encounter (Signed)
Revonda Standard, a PA from Anesthesia at [facility], called requesting Dr. Alphonsus Sias draw a BMP on pt. When pt had labs drawn for surgery the bmp was not released and wasn't tested. If his bp is good then he will be cleared for surgery following the visit. You can contact Revonda Standard back at [phone] for further information.

## 2015-07-15 ENCOUNTER — Ambulatory Visit (INDEPENDENT_AMBULATORY_CARE_PROVIDER_SITE_OTHER): Payer: Managed Care, Other (non HMO) | Admitting: Internal Medicine

## 2015-07-15 ENCOUNTER — Encounter: Payer: Self-pay | Admitting: Internal Medicine

## 2015-07-15 VITALS — BP 158/92 | HR 70 | Temp 98.1°F | Wt 387.2 lb

## 2015-07-15 DIAGNOSIS — I10 Essential (primary) hypertension: Secondary | ICD-10-CM

## 2015-07-15 LAB — RENAL FUNCTION PANEL
Albumin: 3.7 g/dL (ref 3.5–5.2)
BUN: 13 mg/dL (ref 6–23)
CALCIUM: 8.9 mg/dL (ref 8.4–10.5)
CO2: 26 mEq/L (ref 19–32)
CREATININE: 0.98 mg/dL (ref 0.40–1.50)
Chloride: 104 mEq/L (ref 96–112)
GFR: 90.24 mL/min (ref >60.00)
GLUCOSE: 119 mg/dL — AB (ref 70–99)
PHOSPHORUS: 3.1 mg/dL (ref 2.3–4.6)
Potassium: 3.7 mEq/L (ref 3.5–5.1)
Sodium: 138 mEq/L (ref 135–145)

## 2015-07-15 MED ORDER — DILTIAZEM HCL ER 120 MG PO CP24: 120.0000 mg | ORAL_CAPSULE | Freq: Every day | ORAL | Status: DC

## 2015-07-15 NOTE — Progress Notes (Signed)
Pre visit review using our clinic review tool, if applicable. No additional management support is needed unless otherwise documented below in the visit note. 

## 2015-07-15 NOTE — Progress Notes (Signed)
Subjective:    Patient ID: Troy Rogers, male    DOB: 1975/07/21, 40 y.o.   MRN: [id number]  HPI Here due to elevated blood pressure Due for his back surgery BP is always good if the pain isn't bad---up due to the pain  No chest pain No SOB No dizziness or syncope  Current Outpatient Prescriptions on File Prior to Visit  Medication Sig Dispense Refill  . acetaminophen (TYLENOL) 500 MG tablet Take 500 mg by mouth every 6 (six) hours as needed.    Marland Kitchen albuterol (PROVENTIL HFA;VENTOLIN HFA) 108 (90 Base) MCG/ACT inhaler Inhale 1-2 puffs into the lungs every 4 (four) hours as needed for wheezing or shortness of breath.    . allopurinol (ZYLOPRIM) 300 MG tablet Take 300 mg by mouth daily.    . carvedilol (COREG) 25 MG tablet Take 25 mg by mouth 2 (two) times daily with a meal.    . colchicine 0.6 MG tablet Take 0.6 mg by mouth daily as needed.    . furosemide (LASIX) 40 MG tablet Take 1 tablet (40 mg total) by mouth daily as needed. 90 tablet 3  . lisinopril (PRINIVIL,ZESTRIL) 40 MG tablet Take 1 tablet (40 mg total) by mouth daily. 90 tablet 3  . oxyCODONE-acetaminophen (PERCOCET/ROXICET) 5-325 MG tablet Take 1 tablet by mouth every 6 (six) hours as needed for severe pain.    . [DISCONTINUED] cloNIDine (CATAPRES - DOSED IN MG/24 HR) 0.2 mg/24hr patch Place 1 patch (0.2 mg total) onto the skin once a week. (Patient not taking: Reported on 10/08/2014) 4 patch 12   No current facility-administered medications on file prior to visit.    Allergies  Allergen Reactions  . Penicillins Hives    Past Medical History  Diagnosis Date  . Hypertension   . Nephrolithiasis   . Irritable bowel syndrome (IBS)   . Gout   . Cellulitis   . Obstructive sleep apnea     cpap   . Headache     hx migraines     Past Surgical History  Procedure Laterality Date  . Strabismus surgery  1983    Family History  Problem Relation Age of Onset  . Heart disease Mother   . COPD Mother   . Stroke Mother      TIA vs very mild  . COPD Father   . Heart attack Maternal Grandfather   . Heart attack Paternal Grandfather   . Cancer Maternal Grandmother     in throat  . Heart attack Maternal Grandmother   . Heart disease Paternal Grandmother     Social History   Social History  . Marital Status: Divorced    Spouse Name: N/A  . Number of Children: 0  . Years of Education: N/A   Occupational History  . Apartment maintenance    Social History Main Topics  . Smoking status: Never Smoker   . Smokeless tobacco: Never Used  . Alcohol Use: Yes     Comment: ocassionally   . Drug Use: No  . Sexual Activity: Not on file   Other Topics Concern  . Not on file   Social History Narrative   Review of Systems  Appetite is okay Out of work due to the back issues---for 2 weeks Sleeping okay with CPAP     Objective:   Physical Exam  Constitutional: He appears well-developed and well-nourished. No distress.  Neck: Normal range of motion. Neck supple. No thyromegaly present.  Cardiovascular: Normal rate, regular rhythm and  normal heart sounds.  Exam reveals no gallop.   No murmur heard. Pulmonary/Chest: Effort normal and breath sounds normal. No respiratory distress. He has no wheezes. He has no rales.  Musculoskeletal: He exhibits no edema.  Lymphadenopathy:    He has no cervical adenopathy.          Assessment & Plan:

## 2015-07-15 NOTE — Assessment & Plan Note (Signed)
BP Readings from Last 3 Encounters:  07/15/15 158/92  07/12/15 194/108  04/27/15 160/100   BP is better today It clearly seems to be affected by his pain For now, will add another agent (diltiazem) for now---reevaluate 3 months and start weaning meds if BP is better Should be okay to proceed with surgery

## 2015-07-18 ENCOUNTER — Encounter: Payer: Self-pay | Admitting: *Deleted

## 2015-07-18 MED ORDER — VANCOMYCIN HCL 10 G IV SOLR
1500.0000 mg | INTRAVENOUS | Status: AC
  Administered 2015-07-19: 1500 mg via INTRAVENOUS
  Filled 2015-07-18 (×2): qty 1500

## 2015-07-19 ENCOUNTER — Inpatient Hospital Stay (HOSPITAL_COMMUNITY): Payer: Managed Care, Other (non HMO) | Admitting: Certified Registered Nurse Anesthetist

## 2015-07-19 ENCOUNTER — Inpatient Hospital Stay (HOSPITAL_COMMUNITY): Payer: Managed Care, Other (non HMO) | Admitting: Vascular Surgery

## 2015-07-19 ENCOUNTER — Encounter (HOSPITAL_COMMUNITY)
Admission: RE | Disposition: A | Discharge status: Home Health | Payer: Managed Care, Other (non HMO) | Source: Ambulatory Visit | Attending: Neurological Surgery

## 2015-07-19 ENCOUNTER — Inpatient Hospital Stay (HOSPITAL_COMMUNITY)
Admission: RE | Admit: 2015-07-19 | Discharge: 2015-07-24 | DRG: 460 | Disposition: A | Discharge status: Home Health | Payer: Managed Care, Other (non HMO) | Source: Ambulatory Visit | Attending: Neurological Surgery | Admitting: Neurological Surgery

## 2015-07-19 ENCOUNTER — Encounter (HOSPITAL_COMMUNITY): Payer: Self-pay | Admitting: Certified Registered Nurse Anesthetist

## 2015-07-19 ENCOUNTER — Inpatient Hospital Stay (HOSPITAL_COMMUNITY): Payer: Managed Care, Other (non HMO)

## 2015-07-19 DIAGNOSIS — Z6841 Body Mass Index (BMI) 40.0 and over, adult: Secondary | ICD-10-CM | POA: Diagnosis not present

## 2015-07-19 DIAGNOSIS — M4316 Spondylolisthesis, lumbar region: Secondary | ICD-10-CM

## 2015-07-19 DIAGNOSIS — M4806 Spinal stenosis, lumbar region: Secondary | ICD-10-CM | POA: Diagnosis present

## 2015-07-19 DIAGNOSIS — M545 Low back pain: Secondary | ICD-10-CM | POA: Diagnosis present

## 2015-07-19 DIAGNOSIS — I10 Essential (primary) hypertension: Secondary | ICD-10-CM | POA: Diagnosis present

## 2015-07-19 DIAGNOSIS — M109 Gout, unspecified: Secondary | ICD-10-CM | POA: Diagnosis present

## 2015-07-19 DIAGNOSIS — G4733 Obstructive sleep apnea (adult) (pediatric): Secondary | ICD-10-CM | POA: Diagnosis present

## 2015-07-19 DIAGNOSIS — M5416 Radiculopathy, lumbar region: Secondary | ICD-10-CM | POA: Diagnosis present

## 2015-07-19 DIAGNOSIS — Z79899 Other long term (current) drug therapy: Secondary | ICD-10-CM

## 2015-07-19 DIAGNOSIS — Z419 Encounter for procedure for purposes other than remedying health state, unspecified: Secondary | ICD-10-CM

## 2015-07-19 DIAGNOSIS — K589 Irritable bowel syndrome without diarrhea: Secondary | ICD-10-CM | POA: Diagnosis present

## 2015-07-19 HISTORY — DX: Spondylolisthesis, lumbar region: M43.16

## 2015-07-19 SURGERY — POSTERIOR LUMBAR FUSION 1 LEVEL
Anesthesia: General | Site: Back

## 2015-07-19 MED ORDER — METHOCARBAMOL 1000 MG/10ML IJ SOLN
500.0000 mg | Freq: Four times a day (QID) | INTRAVENOUS | Status: DC | PRN
  Filled 2015-07-19: qty 5

## 2015-07-19 MED ORDER — ONDANSETRON HCL 4 MG/2ML IJ SOLN
INTRAMUSCULAR | Status: AC
  Filled 2015-07-19: qty 2

## 2015-07-19 MED ORDER — SODIUM CHLORIDE 0.9 % IV SOLN: 250.0000 mL | INTRAVENOUS | Status: DC

## 2015-07-19 MED ORDER — SUGAMMADEX SODIUM 500 MG/5ML IV SOLN
INTRAVENOUS | Status: AC
  Filled 2015-07-19: qty 5

## 2015-07-19 MED ORDER — MENTHOL 3 MG MT LOZG: 1.0000 | LOZENGE | OROMUCOSAL | Status: DC | PRN

## 2015-07-19 MED ORDER — DOCUSATE SODIUM 100 MG PO CAPS
100.0000 mg | ORAL_CAPSULE | Freq: Two times a day (BID) | ORAL | Status: DC
  Administered 2015-07-19 – 2015-07-24 (×9): 100 mg via ORAL
  Filled 2015-07-19 (×9): qty 1

## 2015-07-19 MED ORDER — THROMBIN 20000 UNITS EX SOLR
CUTANEOUS | Status: DC | PRN
  Administered 2015-07-19: 20 mL via TOPICAL

## 2015-07-19 MED ORDER — FENTANYL CITRATE (PF) 250 MCG/5ML IJ SOLN
INTRAMUSCULAR | Status: AC
  Filled 2015-07-19: qty 5

## 2015-07-19 MED ORDER — POLYETHYLENE GLYCOL 3350 17 G PO PACK
17.0000 g | PACK | Freq: Every day | ORAL | Status: DC | PRN
  Administered 2015-07-21: 17 g via ORAL
  Filled 2015-07-19: qty 1

## 2015-07-19 MED ORDER — LIDOCAINE-EPINEPHRINE 1 %-1:100000 IJ SOLN
INTRAMUSCULAR | Status: DC | PRN
  Administered 2015-07-19: 10 mL

## 2015-07-19 MED ORDER — FENTANYL CITRATE (PF) 100 MCG/2ML IJ SOLN
INTRAMUSCULAR | Status: DC | PRN
  Administered 2015-07-19 (×2): 50 ug via INTRAVENOUS
  Administered 2015-07-19: 200 ug via INTRAVENOUS
  Administered 2015-07-19: 50 ug via INTRAVENOUS
  Administered 2015-07-19: 25 ug via INTRAVENOUS

## 2015-07-19 MED ORDER — OXYCODONE-ACETAMINOPHEN 5-325 MG PO TABS
1.0000 | ORAL_TABLET | Freq: Four times a day (QID) | ORAL | Status: DC | PRN
  Administered 2015-07-20 – 2015-07-24 (×14): 1 via ORAL
  Filled 2015-07-19 (×14): qty 1

## 2015-07-19 MED ORDER — CARVEDILOL 12.5 MG PO TABS
25.0000 mg | ORAL_TABLET | Freq: Two times a day (BID) | ORAL | Status: DC
  Administered 2015-07-20 – 2015-07-24 (×9): 25 mg via ORAL
  Filled 2015-07-19 (×9): qty 2

## 2015-07-19 MED ORDER — SODIUM CHLORIDE 0.9 % IV SOLN
INTRAVENOUS | Status: DC | PRN
  Administered 2015-07-19: 12:00:00 via INTRAVENOUS

## 2015-07-19 MED ORDER — ROCURONIUM BROMIDE 100 MG/10ML IV SOLN
INTRAVENOUS | Status: DC | PRN
  Administered 2015-07-19: 50 mg via INTRAVENOUS

## 2015-07-19 MED ORDER — METHOCARBAMOL 500 MG PO TABS
ORAL_TABLET | ORAL | Status: AC
  Filled 2015-07-19: qty 1

## 2015-07-19 MED ORDER — BUPIVACAINE HCL (PF) 0.5 % IJ SOLN
INTRAMUSCULAR | Status: DC | PRN
  Administered 2015-07-19: 10 mL

## 2015-07-19 MED ORDER — STERILE WATER FOR IRRIGATION IR SOLN
Status: DC | PRN
  Administered 2015-07-19: 1000 mL

## 2015-07-19 MED ORDER — ALBUTEROL SULFATE (2.5 MG/3ML) 0.083% IN NEBU: 2.5000 mg | INHALATION_SOLUTION | RESPIRATORY_TRACT | Status: DC | PRN

## 2015-07-19 MED ORDER — MIDAZOLAM HCL 2 MG/2ML IJ SOLN
INTRAMUSCULAR | Status: AC
  Filled 2015-07-19: qty 2

## 2015-07-19 MED ORDER — OXYCODONE-ACETAMINOPHEN 5-325 MG PO TABS
ORAL_TABLET | ORAL | Status: AC
  Filled 2015-07-19: qty 2

## 2015-07-19 MED ORDER — VANCOMYCIN HCL 10 G IV SOLR
1500.0000 mg | Freq: Once | INTRAVENOUS | Status: AC
  Administered 2015-07-20: 1500 mg via INTRAVENOUS
  Filled 2015-07-19: qty 1500

## 2015-07-19 MED ORDER — VANCOMYCIN HCL 1000 MG IV SOLR
INTRAVENOUS | Status: DC | PRN
  Administered 2015-07-19: 1000 mg via TOPICAL

## 2015-07-19 MED ORDER — HYDROMORPHONE HCL 1 MG/ML IJ SOLN
0.5000 mg | INTRAMUSCULAR | Status: DC | PRN
  Administered 2015-07-19 – 2015-07-23 (×22): 1 mg via INTRAVENOUS
  Filled 2015-07-19 (×23): qty 1

## 2015-07-19 MED ORDER — FUROSEMIDE 40 MG PO TABS: 40.0000 mg | ORAL_TABLET | Freq: Every day | ORAL | Status: DC | PRN

## 2015-07-19 MED ORDER — ACETAMINOPHEN 650 MG RE SUPP: 650.0000 mg | RECTAL | Status: DC | PRN

## 2015-07-19 MED ORDER — COLCHICINE 0.6 MG PO TABS
0.6000 mg | ORAL_TABLET | Freq: Every day | ORAL | Status: DC
Start: 2015-07-20 — End: 2015-07-24
  Administered 2015-07-20 – 2015-07-24 (×5): 0.6 mg via ORAL
  Filled 2015-07-19 (×5): qty 1

## 2015-07-19 MED ORDER — MIDAZOLAM HCL 2 MG/2ML IJ SOLN: 0.5000 mg | Freq: Once | INTRAMUSCULAR | Status: DC | PRN

## 2015-07-19 MED ORDER — VANCOMYCIN HCL 1000 MG IV SOLR
INTRAVENOUS | Status: AC
  Filled 2015-07-19: qty 1000

## 2015-07-19 MED ORDER — LIDOCAINE HCL (CARDIAC) 20 MG/ML IV SOLN
INTRAVENOUS | Status: AC
  Filled 2015-07-19: qty 5

## 2015-07-19 MED ORDER — LACTATED RINGERS IV SOLN
INTRAVENOUS | Status: DC
  Administered 2015-07-19 (×3): via INTRAVENOUS

## 2015-07-19 MED ORDER — PHENOL 1.4 % MT LIQD
1.0000 | OROMUCOSAL | Status: DC | PRN
Start: 2015-07-19 — End: 2015-07-24

## 2015-07-19 MED ORDER — SODIUM CHLORIDE 0.9% FLUSH: 3.0000 mL | INTRAVENOUS | Status: DC | PRN

## 2015-07-19 MED ORDER — MIDAZOLAM HCL 5 MG/5ML IJ SOLN
INTRAMUSCULAR | Status: DC | PRN
  Administered 2015-07-19: 2 mg via INTRAVENOUS

## 2015-07-19 MED ORDER — ALUM & MAG HYDROXIDE-SIMETH 200-200-20 MG/5ML PO SUSP
30.0000 mL | Freq: Four times a day (QID) | ORAL | Status: DC | PRN
Start: 2015-07-19 — End: 2015-07-24

## 2015-07-19 MED ORDER — SODIUM CHLORIDE 0.9% FLUSH
3.0000 mL | Freq: Two times a day (BID) | INTRAVENOUS | Status: DC
  Administered 2015-07-20 – 2015-07-24 (×9): 3 mL via INTRAVENOUS

## 2015-07-19 MED ORDER — METHOCARBAMOL 500 MG PO TABS
500.0000 mg | ORAL_TABLET | Freq: Four times a day (QID) | ORAL | Status: DC | PRN
  Administered 2015-07-19 – 2015-07-24 (×16): 500 mg via ORAL
  Filled 2015-07-19 (×15): qty 1

## 2015-07-19 MED ORDER — ROCURONIUM BROMIDE 50 MG/5ML IV SOLN
INTRAVENOUS | Status: AC
  Filled 2015-07-19: qty 1

## 2015-07-19 MED ORDER — ONDANSETRON HCL 4 MG/2ML IJ SOLN
INTRAMUSCULAR | Status: DC | PRN
  Administered 2015-07-19: 4 mg via INTRAVENOUS

## 2015-07-19 MED ORDER — DEXAMETHASONE SODIUM PHOSPHATE 4 MG/ML IJ SOLN
INTRAMUSCULAR | Status: DC | PRN
  Administered 2015-07-19: 10 mg via INTRAVENOUS

## 2015-07-19 MED ORDER — PROMETHAZINE HCL 25 MG/ML IJ SOLN: 6.2500 mg | INTRAMUSCULAR | Status: DC | PRN

## 2015-07-19 MED ORDER — FLEET ENEMA 7-19 GM/118ML RE ENEM: 1.0000 | ENEMA | Freq: Once | RECTAL | Status: DC | PRN

## 2015-07-19 MED ORDER — HYDROMORPHONE HCL 1 MG/ML IJ SOLN
0.2500 mg | INTRAMUSCULAR | Status: DC | PRN
  Administered 2015-07-19 (×3): 0.5 mg via INTRAVENOUS

## 2015-07-19 MED ORDER — BISACODYL 10 MG RE SUPP: 10.0000 mg | Freq: Every day | RECTAL | Status: DC | PRN

## 2015-07-19 MED ORDER — HYDROMORPHONE HCL 1 MG/ML IJ SOLN
INTRAMUSCULAR | Status: AC
  Administered 2015-07-19: 0.5 mg via INTRAVENOUS
  Filled 2015-07-19: qty 1

## 2015-07-19 MED ORDER — HYDROMORPHONE HCL 1 MG/ML IJ SOLN
INTRAMUSCULAR | Status: AC
  Filled 2015-07-19: qty 1

## 2015-07-19 MED ORDER — SENNA 8.6 MG PO TABS
1.0000 | ORAL_TABLET | Freq: Two times a day (BID) | ORAL | Status: DC
  Administered 2015-07-19 – 2015-07-24 (×9): 8.6 mg via ORAL
  Filled 2015-07-19 (×9): qty 1

## 2015-07-19 MED ORDER — PROPOFOL 10 MG/ML IV BOLUS
INTRAVENOUS | Status: AC
  Filled 2015-07-19: qty 40

## 2015-07-19 MED ORDER — OXYCODONE-ACETAMINOPHEN 5-325 MG PO TABS
1.0000 | ORAL_TABLET | ORAL | Status: DC | PRN
  Administered 2015-07-19: 2 via ORAL

## 2015-07-19 MED ORDER — THROMBIN 5000 UNITS EX SOLR
OROMUCOSAL | Status: DC | PRN
  Administered 2015-07-19 (×2): 5 mL via TOPICAL

## 2015-07-19 MED ORDER — SUGAMMADEX SODIUM 500 MG/5ML IV SOLN
INTRAVENOUS | Status: DC | PRN
  Administered 2015-07-19: 350 mg via INTRAVENOUS

## 2015-07-19 MED ORDER — ALLOPURINOL 100 MG PO TABS
300.0000 mg | ORAL_TABLET | Freq: Every day | ORAL | Status: DC
  Administered 2015-07-20 – 2015-07-24 (×5): 300 mg via ORAL
  Filled 2015-07-19 (×5): qty 3

## 2015-07-19 MED ORDER — ACETAMINOPHEN 325 MG PO TABS: 650.0000 mg | ORAL_TABLET | ORAL | Status: DC | PRN

## 2015-07-19 MED ORDER — ONDANSETRON HCL 4 MG/2ML IJ SOLN
4.0000 mg | INTRAMUSCULAR | Status: DC | PRN
  Administered 2015-07-20: 4 mg via INTRAVENOUS
  Filled 2015-07-19: qty 2

## 2015-07-19 MED ORDER — SODIUM CHLORIDE 0.9 % IV SOLN
INTRAVENOUS | Status: DC
  Administered 2015-07-19: 20:00:00 via INTRAVENOUS

## 2015-07-19 MED ORDER — LISINOPRIL 20 MG PO TABS
40.0000 mg | ORAL_TABLET | Freq: Every day | ORAL | Status: DC
  Administered 2015-07-19 – 2015-07-24 (×6): 40 mg via ORAL
  Filled 2015-07-19 (×6): qty 2

## 2015-07-19 MED ORDER — VECURONIUM BROMIDE 10 MG IV SOLR
INTRAVENOUS | Status: DC | PRN
  Administered 2015-07-19 (×3): 2 mg via INTRAVENOUS
  Administered 2015-07-19 (×2): 1 mg via INTRAVENOUS

## 2015-07-19 MED ORDER — SODIUM CHLORIDE 0.9 % IR SOLN
Status: DC | PRN
  Administered 2015-07-19: 500 mL

## 2015-07-19 MED ORDER — MEPERIDINE HCL 25 MG/ML IJ SOLN: 6.2500 mg | INTRAMUSCULAR | Status: DC | PRN

## 2015-07-19 MED ORDER — PROPOFOL 10 MG/ML IV BOLUS
INTRAVENOUS | Status: DC | PRN
  Administered 2015-07-19: 300 mg via INTRAVENOUS

## 2015-07-19 MED ORDER — DILTIAZEM HCL ER COATED BEADS 120 MG PO CP24
120.0000 mg | ORAL_CAPSULE | Freq: Every day | ORAL | Status: DC
  Administered 2015-07-20 – 2015-07-24 (×5): 120 mg via ORAL
  Filled 2015-07-19 (×6): qty 1

## 2015-07-19 MED ORDER — 0.9 % SODIUM CHLORIDE (POUR BTL) OPTIME
TOPICAL | Status: DC | PRN
Start: 2015-07-19 — End: 2015-07-19
  Administered 2015-07-19: 1000 mL

## 2015-07-19 SURGICAL SUPPLY — 71 items
ADH SKN CLS APL DERMABOND .7 (GAUZE/BANDAGES/DRESSINGS) ×1
BAG DECANTER FOR FLEXI CONT (MISCELLANEOUS) ×2 IMPLANT
BLADE CLIPPER SURG (BLADE) ×1 IMPLANT
BUR MATCHSTICK NEURO 3.0 LAGG (BURR) ×2 IMPLANT
CAGE COROENT LRG MP 12X9X28-8 (Cage) ×2 IMPLANT
CANISTER SUCT 3000ML PPV (MISCELLANEOUS) ×2 IMPLANT
CONT SPEC 4OZ CLIKSEAL STRL BL (MISCELLANEOUS) ×2 IMPLANT
COVER BACK TABLE 60X90IN (DRAPES) ×2 IMPLANT
DECANTER SPIKE VIAL GLASS SM (MISCELLANEOUS) ×1 IMPLANT
DERMABOND ADVANCED (GAUZE/BANDAGES/DRESSINGS) ×1
DERMABOND ADVANCED .7 DNX12 (GAUZE/BANDAGES/DRESSINGS) ×1 IMPLANT
DRAPE C-ARM 42X72 X-RAY (DRAPES) ×4 IMPLANT
DRAPE LAPAROTOMY 100X72X124 (DRAPES) ×2 IMPLANT
DRAPE POUCH INSTRU U-SHP 10X18 (DRAPES) ×2 IMPLANT
DRAPE PROXIMA HALF (DRAPES) ×2 IMPLANT
DRSG OPSITE POSTOP 4X6 (GAUZE/BANDAGES/DRESSINGS) ×1 IMPLANT
DRSG OPSITE POSTOP 4X8 (GAUZE/BANDAGES/DRESSINGS) ×1 IMPLANT
DURAPREP 26ML APPLICATOR (WOUND CARE) ×2 IMPLANT
ELECT BLADE 6.5 EXT (BLADE) ×1 IMPLANT
ELECT REM PT RETURN 9FT ADLT (ELECTROSURGICAL) ×2
ELECTRODE REM PT RTRN 9FT ADLT (ELECTROSURGICAL) ×1 IMPLANT
GAUZE SPONGE 4X4 12PLY STRL (GAUZE/BANDAGES/DRESSINGS) ×1 IMPLANT
GAUZE SPONGE 4X4 16PLY XRAY LF (GAUZE/BANDAGES/DRESSINGS) ×1 IMPLANT
GLOVE BIOGEL PI IND STRL 7.0 (GLOVE) IMPLANT
GLOVE BIOGEL PI IND STRL 8.5 (GLOVE) ×2 IMPLANT
GLOVE BIOGEL PI INDICATOR 7.0 (GLOVE) ×4
GLOVE BIOGEL PI INDICATOR 8.5 (GLOVE) ×2
GLOVE ECLIPSE 7.5 STRL STRAW (GLOVE) ×3 IMPLANT
GLOVE ECLIPSE 8.5 STRL (GLOVE) ×4 IMPLANT
GLOVE ECLIPSE 9.0 STRL (GLOVE) ×1 IMPLANT
GLOVE EXAM NITRILE LRG STRL (GLOVE) IMPLANT
GLOVE EXAM NITRILE MD LF STRL (GLOVE) ×1 IMPLANT
GLOVE EXAM NITRILE XL STR (GLOVE) IMPLANT
GLOVE EXAM NITRILE XS STR PU (GLOVE) IMPLANT
GLOVE INDICATOR 8.0 STRL GRN (GLOVE) ×1 IMPLANT
GOWN STRL REUS W/ TWL LRG LVL3 (GOWN DISPOSABLE) IMPLANT
GOWN STRL REUS W/ TWL XL LVL3 (GOWN DISPOSABLE) IMPLANT
GOWN STRL REUS W/TWL 2XL LVL3 (GOWN DISPOSABLE) ×5 IMPLANT
GOWN STRL REUS W/TWL LRG LVL3 (GOWN DISPOSABLE) ×4
GOWN STRL REUS W/TWL XL LVL3 (GOWN DISPOSABLE) ×2
HEMOSTAT POWDER KIT SURGIFOAM (HEMOSTASIS) ×1 IMPLANT
KIT BASIN OR (CUSTOM PROCEDURE TRAY) ×2 IMPLANT
KIT ROOM TURNOVER OR (KITS) ×2 IMPLANT
MODULE POWER NUVASIVE (MISCELLANEOUS) IMPLANT
NDL SPNL 18GX3.5 QUINCKE PK (NEEDLE) IMPLANT
NEEDLE HYPO 22GX1.5 SAFETY (NEEDLE) ×2 IMPLANT
NEEDLE SPNL 18GX3.5 QUINCKE PK (NEEDLE) ×2 IMPLANT
NS IRRIG 1000ML POUR BTL (IV SOLUTION) ×2 IMPLANT
PACK LAMINECTOMY NEURO (CUSTOM PROCEDURE TRAY) ×2 IMPLANT
PAD ARMBOARD 7.5X6 YLW CONV (MISCELLANEOUS) ×6 IMPLANT
PATTIES SURGICAL .5 X1 (DISPOSABLE) ×1 IMPLANT
PATTIES SURGICAL 1X1 (DISPOSABLE) ×1 IMPLANT
POWER MODULE NUVASIVE (MISCELLANEOUS) ×2
ROD RELINE-O LORD 5.5X40 (Rod) ×2 IMPLANT
SCREW LOCK RELINE 5.5 TULIP (Screw) ×4 IMPLANT
SCREW RELINE-O POLY 6.5X50MM (Screw) ×4 IMPLANT
SLEEVE SURGEON STRL (DRAPES) ×1 IMPLANT
SPONGE LAP 4X18 X RAY DECT (DISPOSABLE) IMPLANT
SPONGE SURGIFOAM ABS GEL 100 (HEMOSTASIS) ×2 IMPLANT
SUT PROLENE 6 0 BV (SUTURE) ×1 IMPLANT
SUT VIC AB 1 CT1 18XBRD ANBCTR (SUTURE) ×1 IMPLANT
SUT VIC AB 1 CT1 8-18 (SUTURE) ×2
SUT VIC AB 2-0 CP2 18 (SUTURE) ×2 IMPLANT
SUT VIC AB 3-0 SH 8-18 (SUTURE) ×3 IMPLANT
SYR 3ML LL SCALE MARK (SYRINGE) ×8 IMPLANT
TOWEL OR 17X24 6PK STRL BLUE (TOWEL DISPOSABLE) ×2 IMPLANT
TOWEL OR 17X26 10 PK STRL BLUE (TOWEL DISPOSABLE) ×2 IMPLANT
TRAP SPECIMEN MUCOUS 40CC (MISCELLANEOUS) ×2 IMPLANT
TRAY FOLEY CATH 16FRSI W/METER (SET/KITS/TRAYS/PACK) ×1 IMPLANT
TRAY FOLEY W/METER SILVER 14FR (SET/KITS/TRAYS/PACK) ×1 IMPLANT
WATER STERILE IRR 1000ML POUR (IV SOLUTION) ×2 IMPLANT

## 2015-07-19 NOTE — Progress Notes (Signed)
ANTIBIOTIC CONSULT NOTE - INITIAL  Pharmacy Consult for Vancomycin Indication: surgical prophylaxis  Allergies  Allergen Reactions  . Penicillins Hives    Patient Measurements: Height:  (167.6 cm) Weight: (!) 387 lb (175.542 kg) IBW/kg (Calculated) : 63.8 Adjusted Body Weight:   Vital Signs: Temp: 98.1 F (36.7 C) (01/24 1949) Temp Source: Oral (01/24 1949) BP: 127/77 mmHg (01/24 1949) Pulse Rate: 84 (01/24 1949) Intake/Output from previous day:   Intake/Output from this shift:    Labs: No results for input(s): WBC, HGB, PLT, LABCREA, CREATININE in the last 72 hours. Estimated Creatinine Clearance: 155.3 mL/min (by C-G formula based on Cr of 0.98). No results for input(s): VANCOTROUGH, VANCOPEAK, VANCORANDOM, GENTTROUGH, GENTPEAK, GENTRANDOM, TOBRATROUGH, TOBRAPEAK, TOBRARND, AMIKACINPEAK, AMIKACINTROU, AMIKACIN in the last 72 hours.   Microbiology: Recent Results (from the past 720 hour(s))  Surgical pcr screen     Status: None   Collection Time: 07/12/15  4:03 PM  Result Value Ref Range Status   MRSA, PCR NEGATIVE NEGATIVE Final   Staphylococcus aureus NEGATIVE NEGATIVE Final    Comment:        The Xpert SA Assay (FDA approved for NASAL specimens in patients over 31 years of age), is one component of a comprehensive surveillance program.  Test performance has been validated by [facility] for patients greater than or equal to 69 year old. It is not intended to diagnose infection nor to guide or monitor treatment.     Medical History: Past Medical History  Diagnosis Date  . Hypertension   . Nephrolithiasis   . Irritable bowel syndrome (IBS)   . Gout   . Cellulitis   . Obstructive sleep apnea     cpap   . Headache     hx migraines     Medications:  Prescriptions prior to admission  Medication Sig Dispense Refill Last Dose  . acetaminophen (TYLENOL) 500 MG tablet Take 500 mg by mouth every 6 (six) hours as needed.   Past Week at Unknown time   . allopurinol (ZYLOPRIM) 300 MG tablet Take 300 mg by mouth daily.   07/18/2015 at Unknown time  . carvedilol (COREG) 25 MG tablet Take 25 mg by mouth 2 (two) times daily with a meal.   07/19/2015 at 0730  . colchicine 0.6 MG tablet Take 0.6 mg by mouth daily as needed.   Past Month at Unknown time  . diltiazem (DILACOR XR) 120 MG 24 hr capsule Take 1 capsule (120 mg total) by mouth daily. 30 capsule 3 07/19/2015 at 0730  . furosemide (LASIX) 40 MG tablet Take 1 tablet (40 mg total) by mouth daily as needed. 90 tablet 3 Past Week at Unknown time  . lisinopril (PRINIVIL,ZESTRIL) 40 MG tablet Take 1 tablet (40 mg total) by mouth daily. 90 tablet 3 07/18/2015 at Unknown time  . oxyCODONE-acetaminophen (PERCOCET/ROXICET) 5-325 MG tablet Take 1 tablet by mouth every 6 (six) hours as needed for severe pain.   Past Week at Unknown time  . albuterol (PROVENTIL HFA;VENTOLIN HFA) 108 (90 Base) MCG/ACT inhaler Inhale 1-2 puffs into the lungs every 4 (four) hours as needed for wheezing or shortness of breath.   More than a month at Unknown time   Scheduled:  . allopurinol  300 mg Oral Daily  . [START ON 07/20/2015] carvedilol  25 mg Oral BID WC  . colchicine  0.6 mg Oral Daily  . diltiazem  120 mg Oral Daily  . docusate sodium  100 mg Oral BID  .  HYDROmorphone      . lisinopril  40 mg Oral Daily  . methocarbamol      . senna  1 tablet Oral BID  . sodium chloride flush  3 mL Intravenous Q12H  . vancomycin       Infusions:  . sodium chloride    . sodium chloride    . lactated ringers 20 mL/hr at 07/19/15 9604   Assessment: 39yo male here for spine surgery. Pharmacy is consulted to dose vancomycin for surgical prophylaxis and pt does not have a drain in place.   Goal of Therapy:  Prevention of infection  Plan:  Vancomycin  IV once  Arlean Hopping. Newman Pies, PharmD, BCPS Clinical Pharmacist Pager [phone] Delle Reining M 07/19/2015,7:59 PM

## 2015-07-19 NOTE — H&P (Signed)
Troy Rogers is an 40 y.o. male.   Chief Complaint: Back pain and bilateral leg pain and weakness HPI: Troy Rogers is a 40 year old individual who has had problems with his back since bouts August or September of this past year. Initially was treated conservatively but his pain continued to progress. An initial x-ray of his back demonstrated a spondylolysis at L4-L5. Despite efforts at conservative management he developed progressive worsening pain and ultimately weakness in his tibialis anterior groups. Repeat flexion-extension films demonstrated then that he developed a spondylolisthesis at L4-L5. A recent MRI demonstrates that he has profound stenosis at the L4-L5 level with bilateral lateral recess stenosis. Having only gotten worse despite efforts at conservative management he is been advised regarding surgical decompression arthrodesis. His major risk factor for the surgery is morbid obesity. He has a BMI of 60.  Past Medical History  Diagnosis Date  . Hypertension   . Nephrolithiasis   . Irritable bowel syndrome (IBS)   . Gout   . Cellulitis   . Obstructive sleep apnea     cpap   . Headache     hx migraines     Past Surgical History  Procedure Laterality Date  . Strabismus surgery  1983    Family History  Problem Relation Age of Onset  . Heart disease Mother   . COPD Mother   . Stroke Mother     TIA vs very mild  . COPD Father   . Heart attack Maternal Grandfather   . Heart attack Paternal Grandfather   . Cancer Maternal Grandmother     in throat  . Heart attack Maternal Grandmother   . Heart disease Paternal Grandmother    Social History:  reports that he has never smoked. He has never used smokeless tobacco. He reports that he drinks alcohol. He reports that he does not use illicit drugs.  Allergies:  Allergies  Allergen Reactions  . Penicillins Hives    Medications Prior to Admission  Medication Sig Dispense Refill  . acetaminophen (TYLENOL) 500 MG tablet  Take 500 mg by mouth every 6 (six) hours as needed.    Marland Kitchen allopurinol (ZYLOPRIM) 300 MG tablet Take 300 mg by mouth daily.    . carvedilol (COREG) 25 MG tablet Take 25 mg by mouth 2 (two) times daily with a meal.    . colchicine 0.6 MG tablet Take 0.6 mg by mouth daily as needed.    . diltiazem (DILACOR XR) 120 MG 24 hr capsule Take 1 capsule (120 mg total) by mouth daily. 30 capsule 3  . furosemide (LASIX) 40 MG tablet Take 1 tablet (40 mg total) by mouth daily as needed. 90 tablet 3  . lisinopril (PRINIVIL,ZESTRIL) 40 MG tablet Take 1 tablet (40 mg total) by mouth daily. 90 tablet 3  . oxyCODONE-acetaminophen (PERCOCET/ROXICET) 5-325 MG tablet Take 1 tablet by mouth every 6 (six) hours as needed for severe pain.    Marland Kitchen albuterol (PROVENTIL HFA;VENTOLIN HFA) 108 (90 Base) MCG/ACT inhaler Inhale 1-2 puffs into the lungs every 4 (four) hours as needed for wheezing or shortness of breath.      No results found for this or any previous visit (from the past 48 hour(s)). No results found.  Review of Systems  HENT: Negative.   Eyes: Negative.   Respiratory: Negative.   Cardiovascular: Negative.   Gastrointestinal: Negative.   Genitourinary: Negative.   Musculoskeletal: Positive for back pain.  Skin: Negative.   Neurological: Positive for tingling, focal weakness  and weakness.  Endo/Heme/Allergies: Negative.   Psychiatric/Behavioral: Negative.     Blood pressure 196/98, pulse 72, temperature 97.8 F (36.6 C), temperature source Oral, resp. rate 20, weight 175.542 kg (387 lb), SpO2 99 %. Physical Exam  Constitutional: He is oriented to person, place, and time. He appears well-developed and well-nourished.  Morbidly obese  HENT:  Head: Normocephalic and atraumatic.  Eyes: Conjunctivae and EOM are normal. Pupils are equal, round, and reactive to light.  Neck: Normal range of motion. Neck supple.  Cardiovascular: Normal rate and regular rhythm.   Respiratory: Effort normal and breath sounds  normal.  GI: Soft. Bowel sounds are normal.  Musculoskeletal:  Market tenderness to palpation and percussion in lower lumbar spine.  Neurological: He is alert and oriented to person, place, and time.  Mild weakness in tibialis anterior bilaterally. Absent reflexes in patellae and Achilles. Sensation is intact in both lower extremities. Station and gait are intact.  Skin: Skin is warm and dry.  Psychiatric: He has a normal mood and affect. His behavior is normal. Judgment and thought content normal.     Assessment/Plan Spondylolisthesis L4-L5 with L5 radiculopathy bilaterally, bilateral leg weakness, morbid obesity Admit for surgical decompression and stabilization L4-L5.  Heela Heishman J 07/19/2015, 11:29 AM

## 2015-07-19 NOTE — Progress Notes (Signed)
Patient ID: Troy Rogers, male   DOB: Oct 25, 1975, 40 y.o.   MRN: [id number] Alert and awake postoperatively Moving legs well Pain under reasonable control Dressing remains dry Stable postop

## 2015-07-19 NOTE — Anesthesia Preprocedure Evaluation (Addendum)
Anesthesia Evaluation  Patient identified by MRN, date of birth, ID band Patient awake    Reviewed: Allergy & Precautions, NPO status , Patient's Chart, lab work & pertinent test results  History of Anesthesia Complications Negative for: history of anesthetic complications  Airway Mallampati: I  TM Distance: >3 FB Neck ROM: Full    Dental  (+) Teeth Intact, Dental Advisory Given   Pulmonary sleep apnea and Continuous Positive Airway Pressure Ventilation , COPD,  COPD inhaler,    Pulmonary exam normal breath sounds clear to auscultation       Cardiovascular hypertension, Pt. on medications and Pt. on home beta blockers (-) angina Rhythm:Regular Rate:Normal  '15 ECHO:  Normal LVF with EF 55-60%, valves OK   Neuro/Psych Chronic back pain: narcotics    GI/Hepatic Neg liver ROS, GERD  Controlled,  Endo/Other  Morbid obesity  Renal/GU negative Renal ROS     Musculoskeletal   Abdominal   Peds  Hematology   Anesthesia Other Findings   Reproductive/Obstetrics                          Anesthesia Physical Anesthesia Plan  ASA: III  Anesthesia Plan: General   Post-op Pain Management:    Induction: Intravenous  Airway Management Planned: Oral ETT  Additional Equipment:   Intra-op Plan:   Post-operative Plan: Extubation in OR  Informed Consent: I have reviewed the patients History and Physical, chart, labs and discussed the procedure including the risks, benefits and alternatives for the proposed anesthesia with the patient or authorized representative who has indicated his/her understanding and acceptance.   Dental advisory given  Plan Discussed with: Anesthesiologist, Surgeon and CRNA  Anesthesia Plan Comments: (Plan routine monitors, GETA)       Anesthesia Quick Evaluation

## 2015-07-19 NOTE — Transfer of Care (Signed)
Immediate Anesthesia Transfer of Care Note  Patient: Troy Rogers  Procedure(s) Performed: Procedure(s) with comments: Posterior Lumbar Four-Five Interbody and Fusion (N/A) - L4-5 Posterior lumbar interbody fusion  Patient Location: PACU  Anesthesia Type:General  Level of Consciousness: awake, alert  and oriented  Airway & Oxygen Therapy: Patient Spontanous Breathing and Patient connected to face mask oxygen  Post-op Assessment: Report given to RN and Post -op Vital signs reviewed and stable  Post vital signs: Reviewed and stable  Last Vitals:  Filed Vitals:   07/19/15 0853 07/19/15 1643  BP: 196/98   Pulse: 72   Temp: 36.6 C 36.3 C  Resp: 20     Complications: No apparent anesthesia complications

## 2015-07-19 NOTE — Op Note (Signed)
Date of surgery: 07/19/2015 Preoperative diagnosis: Spondylolisthesis L4-L5 with bilateral pars defects ,lumbar radiculopathy. Morbid obesity Postoperative diagnosis: Spondylolisthesis L4-L5 with bilateral pars defects, lumbar radiculopathy. Morbid obesity Procedure: L4-L5 decompressive laminectomy decompression of L4 and L5 nerve roots, posterior lumbar interbody arthrodesis with peek spacers local autograft , pedicle screw fixation L4-L5,   Surgeon: Barnett Abu M.D.  Asst.: Julio Sicks M.D.  Indications: Patient is Troy Rogers is a 40 y.o. male who who's had significant back pain and lumbar radiculopathy for over several months period time. A lumbar MRI demonstrates advanced spondylolisthesis with high-grade canal stenosis. he was advised regarding surgical intervention. His BMI is 61.5  Procedure: The patient was brought to the operating room supine on a stretcher. After the smooth induction of general endotracheal anesthesia she was turned prone and the back was prepped with alcohol and DuraPrep. The back was then draped sterilely. A midline incision was created and carried down to the lumbar dorsal fascia. A localizing radiograph identified the L4 and L5 spinous processes. A subligamentous dissection was created at L4 and L5 to expose the interlaminar space at L4 and L5 and the facet joints over the L4-L5 interspace. Laminotomies were were then created removing the entire inferior margin of the lamina of L4 including the inferior facet at the L4-L5 joint. The yellow ligament was taken up and the common dural tube was exposed along with the L4 nerve root superiorly, and the L5 nerve root inferiorly, the disc space was exposed and epidural veins in this region were cauterized and divided. The entire arch of the lamina was then removed as it was completely loose and not attached to the pedicles itself. The L4 nerve roots and the L5 nerve root were dissected with care taken to protect them. The disc  space was opened and a combination of curettes and rongeurs was used to evacuate the disc space fully. The endplates were removed using sharp curettes. An interbody spacer was placed to distract the disc space while the contralateral discectomy was performed. When the entirety of the disc was removed and the endplates were prepared final sizing of the disc space was obtained 11 mm peek spacers with 8 lordosis and 28 mm lengths were chosen and packed with autograft and allograft and placed into the interspace. The remainder of the interspace was packed with autograft . Pedicle entry sites were then chosen using fluoroscopic guidance and 6.5 x 50 mm screws were placed in L4 and 6.5 x 50 mm screws were placed in L5. Precontoured rods were placed between the screw heads. Final radiographs were obtained after placing appropriately sized rods between the pedicle screws at L4-L5 and torquing these to the appropriate tension. The surgical site was inspected carefully to assure the L4 and L5 nerve roots were well decompressed, hemostasis was obtained, and the graft was well packed. Then the retractors were removed and the wound was closed with #1 Vicryl in the lumbar dorsal fascia 2-0 Vicryl in the subcutaneous tissue and 3-0 Vicryl subcuticularly. When he cc of half percent Marcaine was injected into the paraspinous musculature at the time of closure. Blood loss was estimated at 250 cc. The patient tolerated procedure well and was returned to the recovery room in stable condition.

## 2015-07-19 NOTE — Anesthesia Procedure Notes (Signed)
Procedure Name: Intubation Date/Time: 07/19/2015 12:04 PM Performed by: Dairl Ponder Pre-anesthesia Checklist: Patient identified, Timeout performed, Emergency Drugs available, Suction available and Patient being monitored Patient Re-evaluated:Patient Re-evaluated prior to inductionOxygen Delivery Method: Circle system utilized Preoxygenation: Pre-oxygenation with 100% oxygen Intubation Type: IV induction Ventilation: Mask ventilation without difficulty and Two handed mask ventilation required Laryngoscope Size: Mac and 4 Grade View: Grade I Tube type: Oral Tube size: 7.5 mm Number of attempts: 1 Airway Equipment and Method: Stylet Placement Confirmation: ETT inserted through vocal cords under direct vision,  breath sounds checked- equal and bilateral and positive ETCO2 Secured at: 23 cm Tube secured with: Tape Dental Injury: Teeth and Oropharynx as per pre-operative assessment

## 2015-07-20 LAB — BASIC METABOLIC PANEL
ANION GAP: 11 (ref 5–15)
BUN: 19 mg/dL (ref 6–20)
CHLORIDE: 105 mmol/L (ref 101–111)
CO2: 23 mmol/L (ref 22–32)
Calcium: 8.7 mg/dL — ABNORMAL LOW (ref 8.9–10.3)
Creatinine, Ser: 1.33 mg/dL — ABNORMAL HIGH (ref 0.61–1.24)
GFR calc Af Amer: >60 mL/min (ref >60)
GLUCOSE: 162 mg/dL — AB (ref 65–99)
POTASSIUM: 4.2 mmol/L (ref 3.5–5.1)
Sodium: 139 mmol/L (ref 135–145)

## 2015-07-20 LAB — CBC
HEMATOCRIT: 38.4 % — AB (ref 39.0–52.0)
HEMOGLOBIN: 12.6 g/dL — AB (ref 13.0–17.0)
MCH: 30 pg (ref 26.0–34.0)
MCHC: 32.8 g/dL (ref 30.0–36.0)
MCV: 91.4 fL (ref 78.0–100.0)
Platelets: 220 10*3/uL (ref 150–400)
RBC: 4.2 MIL/uL — AB (ref 4.22–5.81)
RDW: 13.8 % (ref 11.5–15.5)
WBC: 16.2 10*3/uL — AB (ref 4.0–10.5)

## 2015-07-20 MED FILL — Sodium Chloride IV Soln 0.9%: INTRAVENOUS | Qty: 1000 | Status: AC

## 2015-07-20 MED FILL — Heparin Sodium (Porcine) Inj 1000 Unit/ML: INTRAMUSCULAR | Qty: 30 | Status: AC

## 2015-07-20 NOTE — Care Management Note (Signed)
Case Management Note  Patient Details  Name: GLEB MCGUIRE MRN: [id number] Date of Birth: 07-21-1975  Subjective/Objective:                    Action/Plan: Patient was admitted for L4-L5 decompressive laminectomy decompression of L4 and L5 nerve roots, posterior lumbar interbody arthrodesis with peek spacers local autograft , pedicle screw fixation L4-L5.  Will follow for discharge needs pending PT/OT evals and physician orders.   Expected Discharge Date:                  Expected Discharge Plan:     In-House Referral:     Discharge planning Services  CM Consult  Post Acute Care Choice:    Choice offered to:     DME Arranged:    DME Agency:     HH Arranged:    HH Agency:     Status of Service:  In process, will continue to follow  Medicare Important Message Given:    Date Medicare IM Given:    Medicare IM give by:    Date Additional Medicare IM Given:    Additional Medicare Important Message give by:     If discussed at Long Length of Stay Meetings, dates discussed:    Additional Comments:  Anda Kraft, RN 07/20/2015, 10:40 AM [phone]

## 2015-07-20 NOTE — Evaluation (Signed)
Occupational Therapy Evaluation Patient Details Name: Troy Rogers MRN: [id number] DOB: Jan 12, 1976 Today's Date: 07/20/2015    History of Present Illness Pt is a 40 y.o. male s/p L4-L5 decompressive laminectomy decompression of L4 and L5 nerve roots, posterior lumbar interbody arthrodesis with peek spacers local autograft , pedicle screw fixation L4-L5. PMH: HTN, Nephrolithiasis, IBS, gout, Obstructive sleep apnea, Cellulitis.     Clinical Impression   Pt reports he was independent with ADLs PTA. Currently pt is overall min guard for functional mobility and max-total assist for LB ADLs. Began safety and ADL education with pt and fiancee. Pt planning to d/c home with intermittent supervision from his fiancee; she works during the day. Recommending HHOT for follow up in order to maximize independence and safety with ADLs and functional mobility upon return home. Pt would benefit from continued skilled OT to increase independence and safety with LB ADLs using AE, toilet transfers, and maintaining back precautions throughout functional activities.     Follow Up Recommendations  Home health OT;Supervision - Intermittent    Equipment Recommendations  3 in 1 bedside comode;Other (comment);Tub/shower bench (Bariatric 3 in 1; AE)    Recommendations for Other Services       Precautions / Restrictions Precautions Precautions: Back;Fall Precaution Booklet Issued: No Precaution Comments: Educated on back precautions. Required Braces or Orthoses: Spinal Brace Spinal Brace: Lumbar corset Restrictions Weight Bearing Restrictions: No      Mobility Bed Mobility               General bed mobility comments: Pt OOB in chair  Transfers Overall transfer level: Needs assistance Equipment used: None Transfers: Sit to/from Stand Sit to Stand: Min guard         General transfer comment: Min guard for safety with sit to stand. VCs for hand placement and technique; pt able to demo good  understanding.    Balance Overall balance assessment: Needs assistance Sitting-balance support: Feet supported Sitting balance-Leahy Scale: Fair     Standing balance support: No upper extremity supported;During functional activity Standing balance-Leahy Scale: Fair                              ADL Overall ADL's : Needs assistance/impaired Eating/Feeding: Set up;Sitting   Grooming: Min guard;Standing       Lower Body Bathing: Maximal assistance;Sit to/from stand Lower Body Bathing Details (indicate cue type and reason): Pt with difficulty reaching bottom secondary to body habitus.     Lower Body Dressing: Maximal assistance;Sit to/from stand Lower Body Dressing Details (indicate cue type and reason): Pt unable to bring one foot over opposite knee. Discussed use of AE for increased independence with LB ADLs; pt interested, will plan to practice next session. Toilet Transfer: Min guard;Ambulation;Regular Toilet;Grab bars Toilet Transfer Details (indicate cue type and reason): Pt demonstrated good technique with transfer to low toilet; recommend use of 3 in 1 over toilet to raise up and give hand rails. Toileting- Clothing Manipulation and Hygiene: Maximal assistance;Sit to/from stand Toileting - Clothing Manipulation Details (indicate cue type and reason): Pt unable to reach bottom. Discussed use of toilet aide but anticipate pt would still have difficulty secondary to body habitus. Pts fiancee reports she can assist as needed but does work during the day.     Functional mobility during ADLs: Min guard;Cane General ADL Comments: Pts fiancee present for OT eval. Educated on maintaining back precautions during functional activities, home safety; pt verbalized understanding.  Vision     Perception     Praxis      Pertinent Vitals/Pain Pain Assessment: Faces Faces Pain Scale: Hurts even more Pain Location: back, incision Pain Descriptors / Indicators:  Sore;Grimacing Pain Intervention(s): Limited activity within patient's tolerance;Monitored during session     Hand Dominance Right   Extremity/Trunk Assessment Upper Extremity Assessment Upper Extremity Assessment: Overall WFL for tasks assessed   Lower Extremity Assessment Lower Extremity Assessment: Defer to PT evaluation   Cervical / Trunk Assessment Cervical / Trunk Assessment: Other exceptions Cervical / Trunk Exceptions: pt s/p spinal sx   Communication Communication Communication: No difficulties   Cognition Arousal/Alertness: Awake/alert Behavior During Therapy: WFL for tasks assessed/performed Overall Cognitive Status: Within Functional Limits for tasks assessed                     General Comments       Exercises       Shoulder Instructions      Home Living Family/patient expects to be discharged to:: Private residence Living Arrangements: Spouse/significant other Available Help at Discharge: Family;Available PRN/intermittently Type of Home: Apartment Home Access: Stairs to enter Entrance Stairs-Number of Steps: 15 Entrance Stairs-Rails: Right;Left Home Layout: One level     Bathroom Shower/Tub: Chief Strategy Officer: Standard Bathroom Accessibility: Yes How Accessible: Accessible via walker Home Equipment: None          Prior Functioning/Environment Level of Independence: Independent             OT Diagnosis: Acute pain;Generalized weakness   OT Problem List: Decreased strength;Decreased activity tolerance;Impaired balance (sitting and/or standing);Decreased knowledge of use of DME or AE;Decreased knowledge of precautions;Obesity;Pain   OT Treatment/Interventions: Self-care/ADL training;Energy conservation;DME and/or AE instruction;Patient/family education    OT Goals(Current goals can be found in the care plan section) Acute Rehab OT Goals Patient Stated Goal: return to PLOF OT Goal Formulation: With patient Time  For Goal Achievement: 08/03/15 Potential to Achieve Goals: Good ADL Goals Pt Will Perform Grooming: with supervision;standing Pt Will Perform Lower Body Bathing: with adaptive equipment;sit to/from stand;with min assist Pt Will Perform Lower Body Dressing: with min assist;with adaptive equipment;sit to/from stand Pt Will Transfer to Toilet: with supervision;ambulating;bedside commode (over toilet) Pt Will Perform Toileting - Clothing Manipulation and hygiene: with min assist;with adaptive equipment;sit to/from stand Additional ADL Goal #1: Pt will independently don/doff back brace in sitting. Additional ADL Goal #2: Pt will independently maintain back precautions throughout ADL activity.  OT Frequency: Min 2X/week   Barriers to D/C: Inaccessible home environment;Decreased caregiver support  15 stairs to get in apartment. Troy Rogers works during the day.       Co-evaluation PT/OT/SLP Co-Evaluation/Treatment: Yes Reason for Co-Treatment: For patient/therapist safety   OT goals addressed during session: ADL's and self-care      End of Session Equipment Utilized During Treatment: Back brace Nurse Communication: Mobility status  Activity Tolerance: Patient tolerated treatment well Patient left: in chair;with call bell/phone within reach;with family/visitor present   Time: 0981-1914 OT Time Calculation (min): 39 min Charges:  OT General Charges $OT Visit: 1 Procedure OT Evaluation $OT Eval Moderate Complexity: 1 Procedure G-Codes:     Gaye Alken M.S., OTR/L Pager: [phone]  07/20/2015, 10:47 AM

## 2015-07-20 NOTE — Progress Notes (Signed)
Foley catheter removed without difficulty. Pt tol well. Discussed importance of ambulation post surgery. Pt verbalizes understanding and states he will get up and walk after fiancee brings breakfast. Pt given urinal in the meantime. Will continue to monitor. Lawson Radar

## 2015-07-20 NOTE — Progress Notes (Signed)
Patient ID: Troy Rogers, male   DOB: [birth year], 40 y.o.   MRN: [id number] Vital signs are stable Foley has been removed this morning Patient comfortable current big boy bed Explained importance of mobilizing patient getting him out of bed ambulating Lower extremity function appears good though he noted some numbness in the right leg postoperatively this now appears to be resolved Will mobilized today Postoperative hematocrit 38 Continue to monitor and mobilize

## 2015-07-20 NOTE — Evaluation (Signed)
Physical Therapy Evaluation Patient Details Name: Troy Rogers MRN: [id number] DOB: Sep 06, 1975 Today's Date: 07/20/2015   History of Present Illness  Pt is a 40 y.o. male s/p L4-L5 decompressive laminectomy decompression of L4 and L5 nerve roots, posterior lumbar interbody arthrodesis with peek spacers local autograft , pedicle screw fixation L4-L5. PMH: HTN, Nephrolithiasis, IBS, gout, Obstructive sleep apnea, Cellulitis.    Clinical Impression  Pt admitted with above diagnosis. Pt currently with functional limitations due to the deficits listed below (see PT Problem List). At the time of PT eval pt was able to perform transfers and ambulation with min guard progressing to supervision by end of session. May want to try RW next session for energy conservation vs. SPC. Pt will benefit from skilled PT to increase their independence and safety with mobility to allow discharge to the venue listed below.       Follow Up Recommendations Outpatient PT;Supervision for mobility/OOB    Equipment Recommendations  Rolling walker with 5" wheels Troy Rogers)    Recommendations for Other Services       Precautions / Restrictions Precautions Precautions: Back;Fall Precaution Booklet Issued: No Precaution Comments: Educated on back precautions. Required Braces or Orthoses: Spinal Brace Spinal Brace: Lumbar corset Restrictions Weight Bearing Restrictions: No      Mobility  Bed Mobility               General bed mobility comments: Pt OOB in chair  Transfers Overall transfer level: Needs assistance Equipment used: None Transfers: Sit to/from Stand Sit to Stand: Min guard         General transfer comment: Min guard for safety with sit to stand. VCs for hand placement and technique; pt able to demo good understanding.  Ambulation/Gait Ambulation/Gait assistance: Min guard;Supervision Ambulation Distance (Feet): 200 Feet Assistive device: None;1 person hand held assist;Straight cane Gait  Pattern/deviations: Step-through pattern;Decreased stride length;Wide base of support Gait velocity: Decreased Gait velocity interpretation: Below normal speed for age/gender General Gait Details: Initially pt was provided with HHA for stability when walking to the bathroom. With gait training in the hall, pt was given a SPC for support. Progresed to supervision, and then back to min guard due to fatigue.   Stairs            Wheelchair Mobility    Modified Rankin (Stroke Patients Only)       Balance Overall balance assessment: Needs assistance Sitting-balance support: Feet supported;No upper extremity supported Sitting balance-Leahy Scale: Fair     Standing balance support: No upper extremity supported;During functional activity Standing balance-Leahy Scale: Fair                               Pertinent Vitals/Pain Pain Assessment: Faces Faces Pain Scale: Hurts even more Pain Location: back, incision Pain Descriptors / Indicators: Operative site guarding;Discomfort Pain Intervention(s): Limited activity within patient's tolerance;Monitored during session;Repositioned    Home Living Family/patient expects to be discharged to:: Private residence Living Arrangements: Spouse/significant other Available Help at Discharge: Family;Available PRN/intermittently Type of Home: Apartment Home Access: Stairs to enter Entrance Stairs-Rails: Doctor, general practice of Steps: 15 Home Layout: One level Home Equipment: None      Prior Function Level of Independence: Independent               Hand Dominance   Dominant Hand: Right    Extremity/Trunk Assessment   Upper Extremity Assessment: Defer to OT evaluation  Lower Extremity Assessment: Generalized weakness      Cervical / Trunk Assessment: Other exceptions  Communication   Communication: No difficulties  Cognition Arousal/Alertness: Awake/alert Behavior During Therapy: WFL  for tasks assessed/performed Overall Cognitive Status: Within Functional Limits for tasks assessed                      General Comments      Exercises        Assessment/Plan    PT Assessment Patient needs continued PT services  PT Diagnosis Difficulty walking;Acute pain   PT Problem List Decreased strength;Decreased range of motion;Decreased activity tolerance;Decreased balance;Decreased mobility;Decreased knowledge of use of DME;Decreased safety awareness;Decreased knowledge of precautions;Pain;Obesity  PT Treatment Interventions DME instruction;Gait training;Functional mobility training;Stair training;Therapeutic activities;Therapeutic exercise;Neuromuscular re-education;Patient/family education   PT Goals (Current goals can be found in the Care Plan section) Acute Rehab PT Goals Patient Stated Goal: return to PLOF PT Goal Formulation: With patient/family Time For Goal Achievement: 07/27/15 Potential to Achieve Goals: Good    Frequency Min 5X/week   Barriers to discharge Inaccessible home environment Pt has 15 stairs to enter apartment    Co-evaluation PT/OT/SLP Co-Evaluation/Treatment: Yes Reason for Co-Treatment: For patient/therapist safety PT goals addressed during session: Mobility/safety with mobility;Balance;Proper use of DME OT goals addressed during session: ADL's and self-care       End of Session Equipment Utilized During Treatment: Back brace Activity Tolerance: Patient limited by fatigue Patient left: in chair;with call bell/phone within reach;with family/visitor present Nurse Communication: Mobility status (Asked RN to order [facility] for room)         Time: 1610-9604 PT Time Calculation (min) (ACUTE ONLY): 39 min   Charges:   PT Evaluation $PT Eval Moderate Complexity: 1 Procedure PT Treatments $Gait Training: 8-22 mins   PT G Codes:        Troy Rogers 07-22-15, 11:06 AM  Troy Rogers, PT, DPT Acute Rehabilitation  Services Pager: [phone]

## 2015-07-21 ENCOUNTER — Ambulatory Visit: Payer: Managed Care, Other (non HMO) | Admitting: Internal Medicine

## 2015-07-21 NOTE — Progress Notes (Signed)
Physical Therapy Treatment Patient Details Name: Troy Rogers MRN: [id number] DOB: [birth year] Today's Date: 07/21/2015    History of Present Illness Pt is a 40 y.o. male s/p L4-L5 decompressive laminectomy decompression of L4 and L5 nerve roots, posterior lumbar interbody arthrodesis with peek spacers local autograft , pedicle screw fixation L4-L5. PMH: HTN, Nephrolithiasis, IBS, gout, Obstructive sleep apnea, Cellulitis.      PT Comments    Pt progressing slowly towards physical therapy goals. Pt was not able to tolerate as much functional activity this session due to pain and fatigue. We were not able to initiate stair training due to fatigue. Pt required a heavy mod assist to transition back to the bed, which due to pt's size, was a difficult transfer. Discussed the possibility of rehab at d/c and pt/fiance are agreeable. Main barrier to return home is 15 stairs to enter apartment. If pt does return home at d/c, will need a bariatric RW. Also recommend hospital bed and ambulance transfer. Will continue to follow and progress as able per POC.   Follow Up Recommendations  CIR;Supervision for mobility/OOB     Equipment Recommendations  Rolling walker with 5" wheels (Bariatric), Hospital bed   Recommendations for Other Services       Precautions / Restrictions Precautions Precautions: Back;Fall Precaution Comments: Pt was educated on back precautions throughout functional mobility. Required Braces or Orthoses: Spinal Brace Spinal Brace: Lumbar corset;Applied in sitting position Restrictions Weight Bearing Restrictions: No    Mobility  Bed Mobility Overal bed mobility: Needs Assistance Bed Mobility: Rolling;Sit to Sidelying Rolling: Min guard       Sit to sidelying: Mod assist General bed mobility comments: Pt was instructed in log roll with HOB flat and rail lowered to simulate home environment. Pt was able to assist ~50% with elevating legs onto bed. Due to pt's size,  increased effort required from therapist to assist pt to sidelying. VC's throughout for hand placement and sequencing for maintenance of back precautions.   Transfers Overall transfer level: Needs assistance Equipment used: Rolling walker (2 wheeled) Transfers: Sit to/from Stand Sit to Stand: Min assist         General transfer comment: Assist required this session for balance support as pt powered-up to full standing position. Increased height of bed helpful.  Ambulation/Gait Ambulation/Gait assistance: Min assist Ambulation Distance (Feet): 60 Feet Assistive device: Rolling walker (2 wheeled) (Bariatric) Gait Pattern/deviations: Step-through pattern;Decreased stride length;Trunk flexed Gait velocity: Decreased Gait velocity interpretation: Below normal speed for age/gender General Gait Details: VC's for improved posture and sequencing/safety with the RW. Pt required min assist for walker management. VC's throughout for self-monitoring for fatigue.    Stairs            Wheelchair Mobility    Modified Rankin (Stroke Patients Only)       Balance Overall balance assessment: Needs assistance Sitting-balance support: Feet supported;No upper extremity supported Sitting balance-Leahy Scale: Fair     Standing balance support: No upper extremity supported;During functional activity Standing balance-Leahy Scale: Poor Standing balance comment: Pt reaching out for furniture to steady himself during dynamic balance activities                    Cognition Arousal/Alertness: Awake/alert Behavior During Therapy: WFL for tasks assessed/performed Overall Cognitive Status: Within Functional Limits for tasks assessed                      Exercises      General  Comments        Pertinent Vitals/Pain Pain Assessment: Faces Faces Pain Scale: Hurts whole lot Pain Location: Incision, back Pain Descriptors / Indicators: Operative site guarding;Discomfort Pain  Intervention(s): Limited activity within patient's tolerance;Monitored during session;Repositioned    Home Living                      Prior Function            PT Goals (current goals can now be found in the care plan section) Acute Rehab PT Goals Patient Stated Goal: return to PLOF PT Goal Formulation: With patient/family Time For Goal Achievement: 07/27/15 Potential to Achieve Goals: Good Progress towards PT goals: Progressing toward goals    Frequency  Min 5X/week    PT Plan Discharge plan needs to be updated    Co-evaluation             End of Session Equipment Utilized During Treatment: Back brace Activity Tolerance: Patient limited by fatigue Patient left: in bed;with call bell/phone within reach;with family/visitor present     Time: 5284-1324 PT Time Calculation (min) (ACUTE ONLY): 39 min  Charges:  $Gait Training: 23-37 mins $Therapeutic Activity: 8-22 mins                    G Codes:      [name] 07/22/15, 11:30 AM  [name], PT, DPT Acute Rehabilitation Services Pager: [phone]

## 2015-07-21 NOTE — Progress Notes (Signed)
Patient ID: Troy Rogers, male   DOB: May 04, 1976, 40 y.o.   MRN: [id number] Vital signs are stable Patient is mobilizing slowly Voiding spontaneously without difficulty No BM yet Dressing appears clean and dry We'll remove outer dressing and allow patient to shower Plan discharge home tomorrow if PT and OT goals of walking steps can be met

## 2015-07-21 NOTE — Progress Notes (Signed)
Inpatient Rehabilitation  Patient was screened by Izyk Marty for appropriateness for an Inpatient Acute Rehab consult.  At this time we are recommending an Inpatient Rehab consult.  Please order if you are agreeable.    Imraan Wendell, M.A., CCC/SLP Admission Coordinator  [facility]  Cell [phone]  

## 2015-07-21 NOTE — Progress Notes (Signed)
Occupational Therapy Treatment Patient Details Name: Troy Rogers MRN: [id number] DOB: 06-14-1976 Today's Date: 07/21/2015    History of present illness Pt is a 40 y.o. male s/p L4-L5 decompressive laminectomy decompression of L4 and L5 nerve roots, posterior lumbar interbody arthrodesis with peek spacers local autograft , pedicle screw fixation L4-L5. PMH: HTN, Nephrolithiasis, IBS, gout, Obstructive sleep apnea, Cellulitis.     OT comments  Pts progress toward OT goals limited by pain and fatigue today. Pt declined OOB activities at this time but agreeable to participate in bath at bed level and AE education. Pt currently set up for UB bathing/dressing and max assist for LB bathing/dressing. Upgraded d/c plan to CIR for further rehab prior to d/c home. Will continue to follow acutely.     Follow Up Recommendations  CIR    Equipment Recommendations  3 in 1 bedside comode;Other (comment);Tub/shower bench (Bariatric 3 in 1; AE)    Recommendations for Other Services Rehab consult    Precautions / Restrictions Precautions Precautions: Back;Fall Precaution Comments: Educated on back precautions throughout functional activities. Required Braces or Orthoses: Spinal Brace Spinal Brace: Lumbar corset;Applied in sitting position Restrictions Weight Bearing Restrictions: No       Mobility Bed Mobility Overal bed mobility: Needs Assistance Bed Mobility: Rolling Rolling: Mod assist        General bed mobility comments: Mod assist to roll to each side for UB/LB bathing. Use of hand rails and VCs for technique.  Transfers          General transfer comment: Not assessed at this time.    Balance                    ADL Overall ADL's : Needs assistance/impaired         Upper Body Bathing: Set up;Bed level   Lower Body Bathing: Maximal assistance;Bed level;Adhering to back precautions   Upper Body Dressing : Set up;Bed level Upper Body Dressing Details (indicate  cue type and reason): Pt able to doff/don hospital gown with set up. Lower Body Dressing: Maximal assistance;Bed level Lower Body Dressing Details (indicate cue type and reason): Max assist to doff/don socks at bed level. Educated on use of reacher, long handled shoe horn, and sock aide; pt declined to practice at this time but verbalized understanding.               General ADL Comments: Pt declined OOB activities at this time secondary to pain and fatigue. Pt preparing to take bath upon OT arrival; assisted pt with bath at bed level. Pt with noted decrease in strength and energy today vs yesterday; discussed possible post acute rehab placement for further rehab; pt and fiance agreeable.      Vision                     Perception     Praxis      Cognition   Behavior During Therapy: WFL for tasks assessed/performed Overall Cognitive Status: Within Functional Limits for tasks assessed                       Extremity/Trunk Assessment               Exercises     Shoulder Instructions       General Comments      Pertinent Vitals/ Pain       Pain Assessment: Faces Faces Pain Scale: Hurts even more Pain Location: back,  incision Pain Descriptors / Indicators: Aching;Grimacing;Sore Pain Intervention(s): Limited activity within patient's tolerance;Monitored during session  Home Living                                          Prior Functioning/Environment              Frequency Min 2X/week     Progress Toward Goals  OT Goals(current goals can now be found in the care plan section)  Progress towards OT goals: Not progressing toward goals - comment (Limited by pain and fatigue)  Acute Rehab OT Goals Patient Stated Goal: return to PLOF OT Goal Formulation: With patient  Plan Discharge plan needs to be updated    Co-evaluation                 End of Session     Activity Tolerance Patient limited by pain;Patient  limited by fatigue   Patient Left in bed;with call bell/phone within reach;with family/visitor present   Nurse Communication          Time: 5621-3086 OT Time Calculation (min): 25 min  Charges: OT General Charges $OT Visit: 1 Procedure OT Treatments $Self Care/Home Management : 23-37 mins  Gaye Alken M.S., OTR/L Pager: [phone]  07/21/2015, 3:12 PM

## 2015-07-22 DIAGNOSIS — M4316 Spondylolisthesis, lumbar region: Secondary | ICD-10-CM

## 2015-07-22 NOTE — Progress Notes (Signed)
CM met with the patient and his fiance about discharge plans since patients insurance denied CIR. Patient reluctant for SNF rehab. Fiance concerned about patients comfort at SNF with them not having Bariatric bed. CM spoke with CSW to ask about bariatric beds at SNF and she stated that was a possibility as some SNFs. CM called Jermaine with [facility] to see how much a bed would be for home and he states insurance will not cover a bariatric air bed at home other than for skin issues. CM informed the patient and fiance of the above. They are hoping patient will improve enough over the next day or 2 to be able to go home. Will notify weekend CM to follow as needed for discharge plans.

## 2015-07-22 NOTE — Progress Notes (Signed)
Patient ID: Troy Rogers, male   DOB: 05-20-1976, 40 y.o.   MRN: [id number] Vital signs are stable Patient's bowels have moved Both physical therapy and occupational therapy recommend CIR Rehabilitation medicine consult has been placed I will dictate tentative discharge if transfer to rehabilitation is possible He has had insurance with [employer]

## 2015-07-22 NOTE — Progress Notes (Signed)
Inpatient Rehabilitation  Met with patient and significant other to discuss team's follow up therapy recommendations.  Patient and significant other, Fisher Hargadon in agreement that patient cannot go home at this time.  They are interested in pursuing an IP Rehab admission.  I have initiated insurance authorization.  Will update team.  Gunnar Fusi, M.A., CCC/SLP [facility]  Cell [phone]

## 2015-07-22 NOTE — Progress Notes (Signed)
Physical Therapy Treatment Patient Details Name: Troy Rogers MRN: [id number] DOB: 28-Dec-1975 Today's Date: 07/22/2015    History of Present Illness Pt is a 40 y.o. male s/p L4-L5 decompressive laminectomy decompression of L4 and L5 nerve roots, posterior lumbar interbody arthrodesis with peek spacers local autograft , pedicle screw fixation L4-L5. PMH: HTN, Nephrolithiasis, IBS, gout, Obstructive sleep apnea, Cellulitis.      PT Comments    Pt progressing towards physical therapy goals. Pt reports that he feels more comfortable returning home than on previous session, however continues to require max assist for peri-care and assistance for transfers in/out of bed. Pt also reports concern with getting in and out of the shower at home by himself. Will maintain CIR recommendation at this time, however if pt does return home will greatly benefit from a hospital bed. Will continue to follow.   Follow Up Recommendations  CIR;Supervision for mobility/OOB     Equipment Recommendations  Rolling walker with 5" wheels (Bariatric)    Recommendations for Other Services       Precautions / Restrictions Precautions Precautions: Back;Fall Precaution Booklet Issued: No Precaution Comments: Educated on back precautions throughout functional activities. Required Braces or Orthoses: Spinal Brace Spinal Brace: Lumbar corset;Applied in sitting position Restrictions Weight Bearing Restrictions: No    Mobility  Bed Mobility Overal bed mobility: Needs Assistance Bed Mobility: Rolling;Sidelying to Sit Rolling: Min guard Sidelying to sit: Min assist       General bed mobility comments: Assist to elevate trunk to full sitting position. Heavy use of rails for support, and walker placed in front of him for added hand support.   Transfers Overall transfer level: Needs assistance Equipment used: Rolling walker (2 wheeled) Transfers: Sit to/from Stand Sit to Stand: Min assist         General  transfer comment: Steadying assist to power-up to full standing position. VC's for hand placement on seated surface for safety.   Ambulation/Gait Ambulation/Gait assistance: Min guard Ambulation Distance (Feet): 120 Feet Assistive device: Rolling walker (2 wheeled) (bari) Gait Pattern/deviations: Step-through pattern;Decreased stride length;Wide base of support Gait velocity: Decreased Gait velocity interpretation: Below normal speed for age/gender General Gait Details: VC's for improved posture and sequencing/safety with the RW. Pt did not require assist with walker management this session.    Stairs Stairs: Yes Stairs assistance: Min guard Stair Management: One rail Left;Step to pattern;Sideways;Forwards Number of Stairs: 7 General stair comments: Pt practiced both forwards with 1 rail and sideways with BUE's on railing for support. Pt feels more comfortable sideways and feels he can manage 15 stairs at home. Due to fatigue and distance back to room, 15 stairs were not attempted at this time.   Wheelchair Mobility    Modified Rankin (Stroke Patients Only)       Balance Overall balance assessment: Needs assistance Sitting-balance support: Feet supported;No upper extremity supported Sitting balance-Leahy Scale: Fair     Standing balance support: No upper extremity supported;During functional activity Standing balance-Leahy Scale: Poor Standing balance comment: Standing at sink pt was able to brush teeth with 1 UE support on the countertop.                     Cognition Arousal/Alertness: Awake/alert Behavior During Therapy: WFL for tasks assessed/performed Overall Cognitive Status: Within Functional Limits for tasks assessed                      Exercises      General Comments  Pertinent Vitals/Pain Pain Assessment: 0-10 Pain Score: 9  Pain Location: Back, incision Pain Descriptors / Indicators: Operative site guarding;Aching Pain  Intervention(s): Limited activity within patient's tolerance;Monitored during session;Repositioned    Home Living                      Prior Function            PT Goals (current goals can now be found in the care plan section) Acute Rehab PT Goals Patient Stated Goal: return to PLOF PT Goal Formulation: With patient/family Time For Goal Achievement: 07/27/15 Potential to Achieve Goals: Good Progress towards PT goals: Progressing toward goals    Frequency  Min 5X/week    PT Plan Current plan remains appropriate    Co-evaluation             End of Session Equipment Utilized During Treatment: Back brace Activity Tolerance: Patient limited by fatigue Patient left: in bed;with call bell/phone within reach;with family/visitor present     Time: 4098-1191 PT Time Calculation (min) (ACUTE ONLY): 43 min  Charges:  $Gait Training: 38-52 mins                    G Codes:      [name] 08/14/15, 9:50 AM   [name], PT, DPT Acute Rehabilitation Services Pager: [phone]

## 2015-07-22 NOTE — Progress Notes (Signed)
Inpatient Rehabilitation  Received insurance denial from [place] [place] for IP Rehab admission due to patient being able to be managed at a lower level of care. Informed IP Rehab MD, patient, significant other, RN CM, and CSW.  Will sign off at this time.  Charlane Ferretti., CCC/SLP Admission Coordinator  [facility] Inpatient Rehabilitation  Cell [phone]

## 2015-07-22 NOTE — Discharge Summary (Addendum)
Physician Discharge Summary  Patient ID: Troy Rogers MRN: [id number] DOB/AGE: Mar 30, 1976 40 y.o.  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 He tolerated the surgery well. He is been ambulating but because of his size he requires considerable help. PT has cleared him for D/c home with home PT.  Consults: rehabilitation medicine  Significant Diagnostic Studies: None  Treatments: surgery: Decompression L4-5 with posterior lumbar interbody arthrodesis.  Discharge Exam: Blood pressure 130/60, pulse 80, temperature 98.9 F (37.2 C), temperature source Oral, resp. rate 20, height  (1.676 m), weight 173.728 kg (383 lb), SpO2 99 %. Incision is clean and dry. Motor function is good in iliopsoas quadriceps tibialis anterior and gastrocs. Station and gait are intact.  Disposition: Home with home PT and assist devices     Medication List    ASK your doctor about these medications        albuterol 108 (90 Base) MCG/ACT inhaler  Commonly known as:  PROVENTIL HFA;VENTOLIN HFA  Inhale 1-2 puffs into the lungs every 4 (four) hours as needed for wheezing or shortness of breath.     allopurinol 300 MG tablet  Commonly known as:  ZYLOPRIM  Take 300 mg by mouth daily.     carvedilol 25 MG tablet  Commonly known as:  COREG  Take 25 mg by mouth 2 (two) times daily with a meal.     colchicine 0.6 MG tablet  Take 0.6 mg by mouth daily as needed.     diltiazem 120 MG 24 hr capsule  Commonly known as:  DILACOR XR  Take 1 capsule (120 mg total) by mouth daily.     furosemide 40  MG tablet  Commonly known as:  LASIX  Take 1 tablet (40 mg total) by mouth daily as needed.     lisinopril 40 MG tablet  Commonly known as:  PRINIVIL,ZESTRIL  Take 1 tablet (40 mg total) by mouth daily.     oxyCODONE-acetaminophen 5-325 MG tablet  Commonly known as:  PERCOCET/ROXICET  Take 1 tablet by mouth every 6 (six) hours as needed for severe pain.     TYLENOL 500 MG tablet  Generic drug:  acetaminophen  Take 500 mg by mouth every 6 (six) hours as needed.         SignedStefani Dama 07/22/2015, 10:58 AM

## 2015-07-22 NOTE — Consult Note (Signed)
Physical Medicine and Rehabilitation Consult Reason for Consult: Lumbar spondylolisthesis with radiculopathy Referring Physician: Dr. Danielle Dess   HPI: Troy Rogers is a 40 y.o. right handed male with history of gout, hypertension, obstructive sleep apnea and morbid obesity of 383 pounds. Patient lives with wife in [place]. 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 One level apartment with 15 steps to entry. Presented 07/19/2015 with increasing low back pain radiating to bilateral lower extremities since August 2016. X-rays and imagings demonstrated spondylolisthesis at L4-5 with bilateral pars defects lumbar radiculopathy. Underwent L4-5 decompressive laminectomy decompression of L4-5 with lumbar interbody arthrodesis 07/19/2015 per Dr. Danielle Dess. Hospital course pain management. Lumbar corset when out of bed. Physical and occupational therapy evaluation completed 07/20/2015 with recommendations of physical medicine rehabilitation consult   Review of Systems  Constitutional: Negative for fever and chills.  HENT: Negative for hearing loss.   Eyes: Negative for blurred vision and double vision.  Respiratory: Negative for cough and shortness of breath.   Cardiovascular: Positive for leg swelling. Negative for chest pain and palpitations.  Gastrointestinal: Positive for constipation. Negative for nausea and vomiting.  Genitourinary: Negative for dysuria and hematuria.  Musculoskeletal: Positive for back pain and joint pain.  Skin: Negative for rash.  Neurological: Negative for seizures and headaches.  All other systems reviewed and are negative.  Past Medical History  Diagnosis Date  . Hypertension   . Nephrolithiasis   . Irritable bowel syndrome (IBS)   . Gout   . Cellulitis   . Obstructive sleep apnea     cpap   . Headache     hx migraines    Past Surgical History  Procedure Laterality Date  . Strabismus surgery   1983   Family History  Problem Relation Age of Onset  . Heart disease Mother   . COPD Mother   . Stroke Mother     TIA vs very mild  . COPD Father   . Heart attack Maternal Grandfather   . Heart attack Paternal Grandfather   . Cancer Maternal Grandmother     in throat  . Heart attack Maternal Grandmother   . Heart disease Paternal Grandmother    Social History:  reports that he has never smoked. He has never used smokeless tobacco. He reports that he drinks alcohol. He reports that he does not use illicit drugs. Allergies:  Allergies  Allergen Reactions  . Penicillins Hives   Medications Prior to Admission  Medication Sig Dispense Refill  . acetaminophen (TYLENOL) 500 MG tablet Take 500 mg by mouth every 6 (six) hours as needed.    Marland Kitchen allopurinol (ZYLOPRIM) 300 MG tablet Take 300 mg by mouth daily.    . carvedilol (COREG) 25 MG tablet Take 25 mg by mouth 2 (two) times daily with a meal.    . colchicine 0.6 MG tablet Take 0.6 mg by mouth daily as needed.    . diltiazem (DILACOR XR) 120 MG 24 hr capsule Take 1 capsule (120 mg total) by mouth daily. 30 capsule 3  . furosemide (LASIX) 40 MG tablet Take 1 tablet (40 mg total) by mouth daily as needed. 90 tablet 3  . lisinopril (PRINIVIL,ZESTRIL) 40 MG tablet Take 1 tablet (40 mg total) by mouth daily. 90 tablet 3  . oxyCODONE-acetaminophen (PERCOCET/ROXICET) 5-325 MG tablet Take 1 tablet by mouth every 6 (six) hours as needed for severe pain.    Marland Kitchen albuterol (PROVENTIL HFA;VENTOLIN HFA)  108 (90 Base) MCG/ACT inhaler Inhale 1-2 puffs into the lungs every 4 (four) hours as needed for wheezing or shortness of breath.      Home: Home Living Family/patient expects to be discharged to:: Private residence Living Arrangements: Spouse/significant other Available Help at Discharge: Family, Available PRN/intermittently Type of Home: Apartment Home Access: Stairs to enter [employer] of Steps: 15 Entrance Stairs-Rails: Right,  Left Home Layout: One level Bathroom Shower/Tub: Engineer, manufacturing systems: Standard Bathroom Accessibility: Yes Home Equipment: None  Functional History: Prior Function Level of Independence: Independent Functional Status:  Mobility: Bed Mobility Overal bed mobility: Needs Assistance Bed Mobility: Rolling, Sidelying to Sit Rolling: Min guard Sidelying to sit: Min assist Sit to sidelying: Mod assist General bed mobility comments: Assist to elevate trunk to full sitting position. Heavy use of rails for support, and walker placed in front of him for added hand support.  Transfers Overall transfer level: Needs assistance Equipment used: Rolling walker (2 wheeled) Transfers: Sit to/from Stand Sit to Stand: Min assist General transfer comment: Steadying assist to power-up to full standing position. VC's for hand placement on seated surface for safety.  Ambulation/Gait Ambulation/Gait assistance: Min guard Ambulation Distance (Feet): 120 Feet Assistive device: Rolling walker (2 wheeled) (bari) Gait Pattern/deviations: Step-through pattern, Decreased stride length, Wide base of support General Gait Details: VC's for improved posture and sequencing/safety with the RW. Pt did not require assist with walker management this session.  Gait velocity: Decreased Gait velocity interpretation: Below normal speed for age/gender Stairs: Yes Stairs assistance: Min guard Stair Management: One rail Left, Step to pattern, Sideways, Forwards Number of Stairs: 7 General stair comments: Pt practiced both forwards with 1 rail and sideways with BUE's on railing for support. Pt feels more comfortable sideways and feels he can manage 15 stairs at home. Due to fatigue and distance back to room, 15 stairs were not attempted at this time.     ADL: ADL Overall ADL's : Needs assistance/impaired Eating/Feeding: Set up, Sitting Grooming: Min guard, Standing Upper Body Bathing: Set up, Bed level Lower  Body Bathing: Maximal assistance, Bed level, Adhering to back precautions Lower Body Bathing Details (indicate cue type and reason): Pt with difficulty reaching bottom secondary to body habitus. Upper Body Dressing : Set up, Bed level Upper Body Dressing Details (indicate cue type and reason): Pt able to doff/don hospital gown with set up. Lower Body Dressing: Maximal assistance, Bed level Lower Body Dressing Details (indicate cue type and reason): Max assist to doff/don socks at bed level. Educated on use of reacher, long handled shoe horn, and sock aide; pt declined to practice at this time but verbalized understanding. Toilet Transfer: Min guard, Ambulation, Regular Toilet, Research officer, political party Details (indicate cue type and reason): Pt demonstrated good technique with transfer to low toilet; recommend use of 3 in 1 over toilet to raise up and give hand rails. Toileting- Clothing Manipulation and Hygiene: Maximal assistance, Sit to/from stand Toileting - Clothing Manipulation Details (indicate cue type and reason): Pt unable to reach bottom. Discussed use of toilet aide but anticipate pt would still have difficulty secondary to body habitus. Pts fiancee reports she can assist as needed but does work during the day. Functional mobility during ADLs: Min guard, Cane General ADL Comments: Pt declined OOB activities at this time secondary to pain and fatigue. Pt preparing to take bath upon OT arrival; assisted pt with bath at bed level. Pt with noted decrease in strength and energy today vs yesterday; discussed  possible post acute rehab placement for further rehab; pt and fiance agreeable.  Cognition: Cognition Overall Cognitive Status: Within Functional Limits for tasks assessed Orientation Level: Oriented X4 Cognition Arousal/Alertness: Awake/alert Behavior During Therapy: WFL for tasks assessed/performed Overall Cognitive Status: Within Functional Limits for tasks assessed  Blood pressure  130/60, pulse 80, temperature 98.9 F (37.2 C), temperature source Oral, resp. rate 20, height  (1.676 m), weight 173.728 kg (383 lb), SpO2 99 %. Physical Exam  Constitutional: He is oriented to person, place, and time.  40 year old right-handed obese male  HENT:  Head: Normocephalic.  Eyes: EOM are normal.  Neck: Normal range of motion. Neck supple. No thyromegaly present.  Cardiovascular: Normal rate and regular rhythm.   Respiratory: Effort normal and breath sounds normal. No respiratory distress.  GI: Soft. Bowel sounds are normal. He exhibits no distension.  Musculoskeletal:  Lumbar/axial tenderness   Neurological: He is alert and oriented to person, place, and time.  UE strength 5/5 LE: 3/5 HF, 4/5 KE and ADF/APF. Mild sensory loss over lateral/proximal right leg. DTRs 1+.   Skin:  Back incision is dressed  Psychiatric: He has a normal mood and affect. His behavior is normal.    No results found for this or any previous visit (from the past 24 hour(s)). No results found.  Assessment/Plan: Diagnosis: Morbidly obese 40 yo male suffering from L4-5 spondylolisthesis with right sided lumbar radiculopathy---s/p decompression and fusion. 1. Does the need for close, 24 hr/day medical supervision in concert with the patient's rehab needs make it unreasonable for this patient to be served in a less intensive setting? Yes 2. Co-Morbidities requiring supervision/potential complications: wound care, pain mgt, htn, OSA 3. Due to bladder management, bowel management, safety, skin/wound care, disease management, medication administration, pain management and patient education, does the patient require 24 hr/day rehab nursing? Yes 4. Does the patient require coordinated care of a physician, rehab nurse, PT (1-2 hrs/day, 5 days/week) and OT (1-2 hrs/day, 5 days/week) to address physical and functional deficits in the context of the above medical diagnosis(es)? Yes Addressing deficits in the  following areas: balance, endurance, locomotion, strength, transferring, bowel/bladder control, bathing, dressing, feeding, grooming, toileting and psychosocial support 5. Can the patient actively participate in an intensive therapy program of at least 3 hrs of therapy per day at least 5 days per week? Yes 6. The potential for patient to make measurable gains while on inpatient rehab is excellent 7. Anticipated functional outcomes upon discharge from inpatient rehab are modified independent  with PT, modified independent and supervision with OT, modified independent and supervision with SLP. 8. Estimated rehab length of stay to reach the above functional goals is: 7 days 9. Does the patient have adequate social supports and living environment to accommodate these discharge functional goals? Yes 10. Anticipated D/C setting: Home 11. Anticipated post D/C treatments: HH therapy 12. Overall Rehab/Functional Prognosis: excellent  RECOMMENDATIONS: This patient's condition is appropriate for continued rehabilitative care in the following setting: CIR Patient has agreed to participate in recommended program. Yes Note that insurance prior authorization may be required for reimbursement for recommended care.  Comment: Pt has 15 steps to enter his apartment. His wife is gone during day.  Motivated to be independent again. Rehab Admissions Coordinator to follow up.  Thanks,  Ranelle Oyster, MD, [place] [facility]     07/22/2015

## 2015-07-23 NOTE — Progress Notes (Signed)
Physical Therapy Treatment Patient Details Name: Troy Rogers MRN: [id number] DOB: 25-Jun-1976 Today's Date: 07/23/2015    History of Present Illness Pt is a 40 y.o. male s/p L4-L5 decompressive laminectomy decompression of L4 and L5 nerve roots, posterior lumbar interbody arthrodesis with peek spacers local autograft , pedicle screw fixation L4-L5. PMH: HTN, Nephrolithiasis, IBS, gout, Obstructive sleep apnea, Cellulitis.      PT Comments    Pt progressing towards physical therapy goals. Was able to progress stair training today, and demonstrated good technique with sideways negotiation of stairs. Pt with decreased tolerance for functional activity which limits overall gait and stair training. Pt will need to be able to complete 15 stairs to enter his apartment at d/c. Will continue to follow and progress as able per POC.    Follow Up Recommendations  Home health PT;Supervision for mobility/OOB     Equipment Recommendations  Rolling walker with 5" wheels;3in1 (PT) (Bariatric)    Recommendations for Other Services       Precautions / Restrictions Precautions Precautions: Back;Fall Precaution Booklet Issued: No Precaution Comments: Educated on back precautions throughout functional activities. Required Braces or Orthoses: Spinal Brace Spinal Brace: Lumbar corset;Applied in sitting position Restrictions Weight Bearing Restrictions: No    Mobility  Bed Mobility Overal bed mobility: Needs Assistance Bed Mobility: Rolling;Sidelying to Sit Rolling: Supervision Sidelying to sit: Min guard     Sit to sidelying: Min assist General bed mobility comments: Pt was able to transition to full sitting position with use of rails and increased time. When returning to S/L, assist required to elevate LE's back into the bed.   Transfers Overall transfer level: Needs assistance Equipment used: Rolling walker (2 wheeled) Transfers: Sit to/from Stand Sit to Stand: Supervision          General transfer comment: VC's for hand placement on seated surface for safety.   Ambulation/Gait Ambulation/Gait assistance: Supervision Ambulation Distance (Feet): 150 Feet Assistive device: Rolling walker (2 wheeled) (Bariatric) Gait Pattern/deviations: Step-through pattern;Decreased stride length;Wide base of support Gait velocity: Decreased Gait velocity interpretation: Below normal speed for age/gender General Gait Details: VC's for improved posture and sequencing/safety with the RW. Pt did not require assist with walker management this session.    Stairs Stairs: Yes Stairs assistance: Min guard Stair Management: One rail Right;Sideways Number of Stairs: 7 General stair comments: Practiced sideways only with BUE's on R railing. Pt was able to complete 7 stairs in a row. Needs to progress to 15 for return home.   Wheelchair Mobility    Modified Rankin (Stroke Patients Only)       Balance Overall balance assessment: Needs assistance Sitting-balance support: Feet supported;No upper extremity supported Sitting balance-Leahy Scale: Fair     Standing balance support: No upper extremity supported;During functional activity Standing balance-Leahy Scale: Fair                      Cognition Arousal/Alertness: Awake/alert Behavior During Therapy: WFL for tasks assessed/performed Overall Cognitive Status: Within Functional Limits for tasks assessed                      Exercises      General Comments        Pertinent Vitals/Pain Pain Assessment: Faces Faces Pain Scale: Hurts even more Pain Location: back Pain Descriptors / Indicators: Operative site guarding;Discomfort Pain Intervention(s): Limited activity within patient's tolerance;Monitored during session;Repositioned    Home Living  Prior Function            PT Goals (current goals can now be found in the care plan section) Acute Rehab PT Goals Patient Stated  Goal: return to PLOF PT Goal Formulation: With patient/family Time For Goal Achievement: 07/27/15 Potential to Achieve Goals: Good Progress towards PT goals: Progressing toward goals    Frequency  Min 5X/week    PT Plan Discharge plan needs to be updated    Co-evaluation             End of Session Equipment Utilized During Treatment: Back brace Activity Tolerance: Patient limited by pain Patient left: in bed;with call bell/phone within reach;with family/visitor present     Time: 1610-9604 PT Time Calculation (min) (ACUTE ONLY): 33 min  Charges:  $Gait Training: 23-37 mins                    G Codes:      [name] 08/10/15, 2:36 PM   [name], PT, DPT Acute Rehabilitation Services Pager: [phone]

## 2015-07-23 NOTE — Progress Notes (Signed)
No acute events. Insurance denied rehab.  Patient feels like he needs at least one more day. AVSS Awake and alert Full strength lower extremities Stable Hopefully home tomorrow

## 2015-07-24 MED ORDER — OXYCODONE-ACETAMINOPHEN 5-325 MG PO TABS: 1.0000 | ORAL_TABLET | ORAL | Status: DC | PRN

## 2015-07-24 MED ORDER — DIAZEPAM 5 MG PO TABS: 5.0000 mg | ORAL_TABLET | Freq: Four times a day (QID) | ORAL | Status: DC | PRN

## 2015-07-24 MED ORDER — SENNA 8.6 MG PO TABS: 1.0000 | ORAL_TABLET | Freq: Two times a day (BID) | ORAL | Status: DC

## 2015-07-24 MED ORDER — METHOCARBAMOL 750 MG PO TABS: 750.0000 mg | ORAL_TABLET | Freq: Four times a day (QID) | ORAL | Status: DC

## 2015-07-24 NOTE — Progress Notes (Signed)
No acute events AVSS Moving legs well Ready for d/c

## 2015-07-24 NOTE — Progress Notes (Signed)
Physical Therapy Treatment Patient Details Name: Troy Rogers MRN: [id number] DOB: Jun 28, 1975 Today's Date: 07/24/2015    History of Present Illness Pt is a 40 y.o. male s/p L4-L5 decompressive laminectomy decompression of L4 and L5 nerve roots, posterior lumbar interbody arthrodesis with peek spacers local autograft , pedicle screw fixation L4-L5. PMH: HTN, Nephrolithiasis, IBS, gout, Obstructive sleep apnea, Cellulitis.      PT Comments    Motivated to work on mobility despite being in 8-8.5 out of 10 pain; Cues to self-monitor for activity tolerance; continuing gait ans stair training; his fiance has made bolsters and blaocks to help with bed mobiltiy and shower access at home.  While we have not yet performed all 12-15 steps to enter his home, I believe he will be able to manage them; Pain management seems to be our biggest obstacle to dc;   Will continue to follow   Follow Up Recommendations  Home health PT;Supervision for mobility/OOB     Equipment Recommendations  Rolling walker with 5" wheels;3in1 (PT) (Bariatric)    Recommendations for Other Services       Precautions / Restrictions Precautions Precautions: Back;Fall Precaution Comments: Educated on back precautions throughout functional activities. Patient able to recall precautions and maintains precautions with functional mobility.  Required Braces or Orthoses: Spinal Brace Spinal Brace: Lumbar corset;Applied in sitting position Restrictions Weight Bearing Restrictions: No    Mobility  Bed Mobility Overal bed mobility: Needs Assistance Bed Mobility: Rolling;Sidelying to Sit Rolling: Supervision Sidelying to sit: Min guard (without physical contact)       General bed mobility comments: Pt was able to transition to full sitting position with use of rails and increased time. PT's fiancee has made a stepstool-type structure to use getting in and out of bed at home  Transfers Overall transfer level: Needs  assistance Equipment used: Rolling walker (2 wheeled) Transfers: Sit to/from Stand Sit to Stand: Supervision         General transfer comment: VC's for hand placement on seated surface for safety.   Ambulation/Gait Ambulation/Gait assistance: Supervision Ambulation Distance (Feet): 120 Feet Assistive device: Rolling walker (2 wheeled) (Bariatric) Gait Pattern/deviations: Step-through pattern;Decreased stride length;Wide base of support Gait velocity: Decreased   General Gait Details: Adjusted RW height for optimal fit; Cues to self-monitor for activity tolerance   Stairs Stairs: Yes Stairs assistance: Min guard Stair Management: One rail Right;Sideways;Step to pattern Number of Stairs: 7 General stair comments: Practiced sideways only with BUE's on R railing. Cues for foot placement; Pt was able to complete 7 stairs in a row. Needs to progress to 15 for return home.   Wheelchair Mobility    Modified Rankin (Stroke Patients Only)       Balance     Sitting balance-Leahy Scale: Fair       Standing balance-Leahy Scale: Fair                      Cognition Arousal/Alertness: Awake/alert Behavior During Therapy: WFL for tasks assessed/performed Overall Cognitive Status: Within Functional Limits for tasks assessed                      Exercises      General Comments        Pertinent Vitals/Pain Pain Assessment: 0-10 Pain Score: 8  (8.5) Pain Location: Back pain Pain Descriptors / Indicators: Aching;Grimacing;Guarding;Discomfort Pain Intervention(s): Limited activity within patient's tolerance;Monitored during session    Home Living  Prior Function            PT Goals (current goals can now be found in the care plan section) Acute Rehab PT Goals Patient Stated Goal: return to PLOF PT Goal Formulation: With patient/family Time For Goal Achievement: 07/27/15 Potential to Achieve Goals: Good Progress towards  PT goals: Progressing toward goals    Frequency  Min 5X/week    PT Plan Discharge plan needs to be updated    Co-evaluation             End of Session Equipment Utilized During Treatment: Back brace Activity Tolerance: Patient tolerated treatment well;Patient limited by pain Patient left: in chair;with call bell/phone within reach;with family/visitor present     Time: 4098-1191 PT Time Calculation (min) (ACUTE ONLY): 40 min  Charges:  $Gait Training: 23-37 mins $Therapeutic Activity: 8-22 mins                    G Codes:      Olen Pel 07/24/2015, 9:45 AM  Van Clines, PT  Acute Rehabilitation Services Pager [phone] Office [phone]

## 2015-07-24 NOTE — Progress Notes (Signed)
D/C orders received, pt for D/C home today with home health physical therapy.  IV and telemetry D/C.  Rx and D/C instructions given with verbalized understanding. EMS transported.

## 2015-07-24 NOTE — Progress Notes (Signed)
CM received call to arrange for HHPT.  Pt has [employer] which is not accepted by [facility], [place], or [facility].  [facility]  home health agency which cannot staff (pt requires 2 person asst bc of weight).  CM called Dr. Bevely Palmer who has given orders for outpt PT.  Unfortunately, pt has 15 steps at home to navigate so this CM will explore other [facility] agencies. CM reached [facility] [phone] who has accepted referral for HHPT.  Facesheet , orders, PT eval notes, DC summary, H&P and OP note faxed to [phone] with [facility] #[id number] noted per [facility] request.  Cm called pt and notified of [facility] 07/26/15.  [facility] DME Rep, Trey Paula delivered rW and 3n1 to room prior to discharge.  No other CM needs were communicated.

## 2015-07-25 ENCOUNTER — Telehealth: Payer: Self-pay

## 2015-07-25 NOTE — Telephone Encounter (Signed)
Patient was admitted and discharged from [facility] on 07/19/15-07/24/15. Admitting diagnoses was L4-L5 spondylolisthesis with radiculopathy, bilateral spondylolysis, and morbid obesity. Patient was discharged to home. Support and care provided by significant other. Pain is 7/10. Clarified pain medication order with patient as he is taking 1 tablet PO every 3 hours instead of every 4 hours. Patient confirms use of cane and portable toilet. To date, patient has not been contacted by social work or home health agency to initiate PT as ordered.   Transition Care Management Follow-up Telephone Call   Date discharged? 07/24/2015   How have you been since you were released from the hospital? Health status improving   Do you understand why you were in the hospital? Yes   Do you understand the discharge instructions? Yes   Where were you discharged to? Home   Items Reviewed:  Medications reviewed: Yes  Allergies reviewed: Yes  Dietary changes reviewed: Yes  Referrals reviewed: Yes   Functional Questionnaire:   Activities of Daily Living (ADLs):   He states they are independent in the following: feeding States they require assistance with the following: requires assistance with bathing, hygiene, grooming, and toileting   Any transportation issues/concerns?: No    Any patient concerns? No    Confirmed importance and date/time of follow-up visits scheduled Yes  Pending post-surgical f/u with Dr. Danielle Dess within 3 wks.    Confirmed with patient if condition begins to worsen call PCP or go to the ER.  Patient was given the office number and encouraged to call back with question or concerns.  : Yes

## 2015-07-28 NOTE — Anesthesia Postprocedure Evaluation (Signed)
Anesthesia Post Note  Patient: Troy Rogers  Procedure(s) Performed: Procedure(s) (LRB): Posterior Lumbar Four-Five Interbody and Fusion (N/A)  Patient location during evaluation: PACU Anesthesia Type: General Level of consciousness: awake and alert Pain management: pain level controlled Vital Signs Assessment: post-procedure vital signs reviewed and stable Respiratory status: spontaneous breathing, nonlabored ventilation, respiratory function stable and patient connected to nasal cannula oxygen Cardiovascular status: blood pressure returned to baseline and stable Postop Assessment: no signs of nausea or vomiting Anesthetic complications: no                 Reino Kent

## 2015-08-26 ENCOUNTER — Other Ambulatory Visit: Payer: Self-pay | Admitting: Family Medicine

## 2015-08-31 ENCOUNTER — Other Ambulatory Visit: Payer: Self-pay

## 2015-08-31 NOTE — Telephone Encounter (Signed)
Pt left /vm requesting refill carvedilol and allopurinol to [facility] ; pt seen 07/15/15; do not see where Dr Alphonsus SiasLetvak has filled these meds before.Please advise.

## 2015-09-01 MED ORDER — ALLOPURINOL 300 MG PO TABS: 300.0000 mg | ORAL_TABLET | Freq: Every day | ORAL | Status: DC

## 2015-09-01 MED ORDER — CARVEDILOL 25 MG PO TABS: 25.0000 mg | ORAL_TABLET | Freq: Two times a day (BID) | ORAL | Status: DC

## 2015-09-01 NOTE — Telephone Encounter (Signed)
Approved: okay to fill for a year 

## 2015-09-01 NOTE — Telephone Encounter (Signed)
Rxs sent electronically.  

## 2015-10-14 ENCOUNTER — Telehealth: Payer: Self-pay | Admitting: Internal Medicine

## 2015-10-14 ENCOUNTER — Ambulatory Visit: Payer: Managed Care, Other (non HMO) | Admitting: Internal Medicine

## 2015-10-14 NOTE — Telephone Encounter (Signed)
Patient did not come for their scheduled appointment today 3 month follow up.  Please let me know if the patient needs to be contacted immediately for follow up or if no follow up is necessary.   ° °

## 2015-10-15 NOTE — Telephone Encounter (Signed)
He should reschedule at his convenience---but hopefully soon, as I thought he could get off some of his medications

## 2015-10-17 NOTE — Telephone Encounter (Signed)
Left message asking pt to call office  °

## 2015-10-18 NOTE — Telephone Encounter (Signed)
Appointment 4/26 

## 2015-10-19 ENCOUNTER — Encounter: Payer: Self-pay | Admitting: Internal Medicine

## 2015-10-19 ENCOUNTER — Ambulatory Visit (INDEPENDENT_AMBULATORY_CARE_PROVIDER_SITE_OTHER): Payer: Managed Care, Other (non HMO) | Admitting: Internal Medicine

## 2015-10-19 VITALS — BP 140/92 | HR 67 | Temp 98.2°F | Wt 394.0 lb

## 2015-10-19 DIAGNOSIS — I10 Essential (primary) hypertension: Secondary | ICD-10-CM

## 2015-10-19 NOTE — Progress Notes (Signed)
Subjective:    Patient ID: Troy Rogers, male    DOB: 1975-08-01, 40 y.o.   MRN: [id number]  HPI Here for follow up of HTN  3 months after back surgery Back is better Occasional hip pain Still not working Still needs help---like cleaning up after BM. Okay in shower Dresses himself with sock helper  Has been taking his BP meds He checks occasionally May be high today--didn't take pain meds due to need to drive  No chest pain No SOB No dizziness or syncope  Current Outpatient Prescriptions on File Prior to Visit  Medication Sig Dispense Refill  . albuterol (PROVENTIL HFA;VENTOLIN HFA) 108 (90 Base) MCG/ACT inhaler Inhale 1-2 puffs into the lungs every 4 (four) hours as needed for wheezing or shortness of breath.    . allopurinol (ZYLOPRIM) 300 MG tablet Take 1 tablet (300 mg total) by mouth daily. 30 tablet 11  . carvedilol (COREG) 25 MG tablet Take 1 tablet (25 mg total) by mouth 2 (two) times daily with a meal. 60 tablet 11  . colchicine 0.6 MG tablet Take 0.6 mg by mouth daily as needed.    . diltiazem (DILACOR XR) 120 MG 24 hr capsule Take 1 capsule (120 mg total) by mouth daily. 30 capsule 3  . furosemide (LASIX) 40 MG tablet Take 1 tablet (40 mg total) by mouth daily as needed. 90 tablet 3  . lisinopril (PRINIVIL,ZESTRIL) 40 MG tablet Take 1 tablet (40 mg total) by mouth daily. 90 tablet 3  . methocarbamol (ROBAXIN) 750 MG tablet Take 1 tablet (750 mg total) by mouth 4 (four) times daily. 120 tablet 2  . oxyCODONE-acetaminophen (PERCOCET/ROXICET) 5-325 MG tablet Take 1 tablet by mouth every 6 (six) hours as needed for severe pain.    Marland Kitchen. senna (SENOKOT) 8.6 MG TABS tablet Take 1 tablet (8.6 mg total) by mouth 2 (two) times daily. 120 each 0  . [DISCONTINUED] cloNIDine (CATAPRES - DOSED IN MG/24 HR) 0.2 mg/24hr patch Place 1 patch (0.2 mg total) onto the skin once a week. (Patient not taking: Reported on 10/08/2014) 4 patch 12   No current facility-administered medications on  file prior to visit.    Allergies  Allergen Reactions  . Penicillins Hives    Past Medical History  Diagnosis Date  . Hypertension   . Nephrolithiasis   . Irritable bowel syndrome (IBS)   . Gout   . Cellulitis   . Obstructive sleep apnea     cpap   . Headache     hx migraines     Past Surgical History  Procedure Laterality Date  . Strabismus surgery  1983    Family History  Problem Relation Age of Onset  . Heart disease Mother   . COPD Mother   . Stroke Mother     TIA vs very mild  . COPD Father   . Heart attack Maternal Grandfather   . Heart attack Paternal Grandfather   . Cancer Maternal Grandmother     in throat  . Heart attack Maternal Grandmother   . Heart disease Paternal Grandmother     Social History   Social History  . Marital Status: Divorced    Spouse Name: N/A  . Number of Children: 0  . Years of Education: N/A   Occupational History  . Apartment maintenance    Social History Main Topics  . Smoking status: Never Smoker   . Smokeless tobacco: Never Used  . Alcohol Use: Yes  Comment: ocassionally   . Drug Use: No  . Sexual Activity: Not on file   Other Topics Concern  . Not on file   Social History Narrative   Review of Systems Has gained some weight---limited activity post op Still sleeps with CPAP    Objective:   Physical Exam  Constitutional: He appears well-developed and well-nourished. No distress.  Neck: Normal range of motion. No thyromegaly present.  Cardiovascular: Normal rate, regular rhythm and normal heart sounds.  Exam reveals no gallop.   No murmur heard. Pulmonary/Chest: Effort normal and breath sounds normal. No respiratory distress. He has no wheezes. He has no rales.  Lymphadenopathy:    He has no cervical adenopathy.          Assessment & Plan:

## 2015-10-19 NOTE — Assessment & Plan Note (Signed)
BP Readings from Last 3 Encounters:  10/19/15 140/92  07/24/15 130/57  07/15/15 158/92   Reasonable control Will work on fitness No change in meds

## 2015-10-19 NOTE — Progress Notes (Signed)
Pre visit review using our clinic review tool, if applicable. No additional management support is needed unless otherwise documented below in the visit note. 

## 2015-12-22 ENCOUNTER — Other Ambulatory Visit: Payer: Self-pay | Admitting: Internal Medicine

## 2016-01-16 ENCOUNTER — Other Ambulatory Visit (HOSPITAL_COMMUNITY): Payer: Self-pay | Admitting: Neurological Surgery

## 2016-01-16 ENCOUNTER — Other Ambulatory Visit: Payer: Self-pay | Admitting: Neurological Surgery

## 2016-01-16 DIAGNOSIS — M4316 Spondylolisthesis, lumbar region: Secondary | ICD-10-CM

## 2016-02-07 ENCOUNTER — Ambulatory Visit (HOSPITAL_COMMUNITY)
Admission: RE | Admit: 2016-02-07 | Discharge: 2016-02-07 | Disposition: A | Discharge status: Home / Self Care | Payer: Managed Care, Other (non HMO) | Source: Ambulatory Visit | Attending: Neurological Surgery | Admitting: Neurological Surgery

## 2016-02-07 ENCOUNTER — Encounter (HOSPITAL_COMMUNITY): Payer: Self-pay

## 2016-02-07 DIAGNOSIS — Z981 Arthrodesis status: Secondary | ICD-10-CM | POA: Insufficient documentation

## 2016-02-07 DIAGNOSIS — M47898 Other spondylosis, sacral and sacrococcygeal region: Secondary | ICD-10-CM | POA: Insufficient documentation

## 2016-02-07 DIAGNOSIS — M5116 Intervertebral disc disorders with radiculopathy, lumbar region: Secondary | ICD-10-CM | POA: Insufficient documentation

## 2016-02-07 DIAGNOSIS — M4316 Spondylolisthesis, lumbar region: Secondary | ICD-10-CM

## 2016-02-07 DIAGNOSIS — M4806 Spinal stenosis, lumbar region: Secondary | ICD-10-CM | POA: Insufficient documentation

## 2016-02-07 DIAGNOSIS — M5117 Intervertebral disc disorders with radiculopathy, lumbosacral region: Secondary | ICD-10-CM | POA: Diagnosis not present

## 2016-02-07 MED ORDER — IOPAMIDOL (ISOVUE-M 200) INJECTION 41%
INTRAMUSCULAR | Status: AC
  Administered 2016-02-07: 14 mL via INTRATHECAL
  Filled 2016-02-07: qty 10

## 2016-02-07 MED ORDER — LIDOCAINE HCL 1 % IJ SOLN
INTRAMUSCULAR | Status: AC
  Filled 2016-02-07: qty 10

## 2016-02-07 MED ORDER — OXYCODONE-ACETAMINOPHEN 5-325 MG PO TABS: 1.0000 | ORAL_TABLET | ORAL | Status: DC | PRN

## 2016-02-07 MED ORDER — ONDANSETRON HCL 4 MG/2ML IJ SOLN: 4.0000 mg | Freq: Four times a day (QID) | INTRAMUSCULAR | Status: DC | PRN

## 2016-02-07 MED ORDER — LIDOCAINE HCL (PF) 1 % IJ SOLN
10.0000 mL | Freq: Once | INTRAMUSCULAR | Status: AC
  Administered 2016-02-07: 5 mL via INTRADERMAL

## 2016-02-07 MED ORDER — DIAZEPAM 5 MG PO TABS
ORAL_TABLET | ORAL | Status: AC
  Administered 2016-02-07: 10 mg via ORAL
  Filled 2016-02-07: qty 2

## 2016-02-07 MED ORDER — DIAZEPAM 5 MG PO TABS
10.0000 mg | ORAL_TABLET | Freq: Once | ORAL | Status: AC
  Administered 2016-02-07: 10 mg via ORAL
  Filled 2016-02-07: qty 2

## 2016-02-07 MED ORDER — IOPAMIDOL (ISOVUE-M 200) INJECTION 41%
20.0000 mL | Freq: Once | INTRAMUSCULAR | Status: AC
  Administered 2016-02-07: 14 mL via INTRATHECAL

## 2016-02-07 NOTE — Procedures (Signed)
Troy BlitzJohn Rogers is a 40 year old individual who's had significant back pain and leg pain. He's had decompression and fusion at L4-L5 secondary to a spondylolisthesis. There is a concern that he has adjacent level disease is he's been getting increasing pain into his lower extremities. He's been experiencing chronic weakness. He has morbid obesity. A myelogram is now being performed to better visualize the adjacent areas.  Pre op Dx: Status post fusion L4-L5 with lumbar radiculopathy Post op Dx: Same Procedure: Lumbar myelogram Surgeon: Amos Micheals Puncture level: L2-3 Fluid color: Clear colorless Injection: Iohexol 180. 14th cc Findings: No significant stenosis. Further evaluation with CT scanning

## 2016-02-07 NOTE — Discharge Instructions (Signed)
Myelography, Care After °These instructions give you information on caring for yourself after your procedure. Your doctor may also give you more specific instructions. Call your doctor if you have any problems or questions after your procedure. °HOME CARE °· Rest the first day. °· When you rest, lie flat, with your head slightly raised (elevated). °· Avoid heavy lifting and activity for 48 hours, or as told by your doctor. °· You may take the bandage (dressing) off one day after the test, or as told by your doctor. °· Take all medicines only as told by your doctor. °· Ask your doctor when it is okay to take a shower or bath. °· Ask your doctor when your test results will be ready and how you can get them. Make sure you follow up and get your results. °· Do not drink alcohol for 24 hours, or as told by your doctor. °· Drink enough fluid to keep your pee (urine) clear or pale yellow. °GET HELP IF:  °· You have a fever. °· You have a headache. °· You feel sick to your stomach (nauseous) or throw up (vomit). °· You have pain or cramping in your belly (abdomen). °GET HELP RIGHT AWAY IF:  °· You have a headache with a stiff neck or fever. °· You have trouble breathing. °· Any of the places where the needles were put in are: °¨ Puffy (swollen) or red. °¨ Sore or hot to the touch. °¨ Draining yellowish-white fluid (pus). °¨ Bleeding. °MAKE SURE YOU: °· Understand these instructions. °· Will watch your condition. °· Will get help right away if you are not doing well or get worse. °  °This information is not intended to replace advice given to you by your health care provider. Make sure you discuss any questions you have with your health care provider. °  °Document Released: 03/20/2008 Document Revised: 07/02/2014 Document Reviewed: 03/05/2012 °Elsevier Interactive Patient Education ©2016 [employer] ° °

## 2016-02-20 ENCOUNTER — Telehealth: Payer: Self-pay | Admitting: Internal Medicine

## 2016-02-20 NOTE — Telephone Encounter (Signed)
[facility]  [facility]  Patient will make an appointment to follow up and discuss the lab work.  Patient is trying to coordinate his schedule and will call back to schedule appointment.

## 2016-02-20 NOTE — Telephone Encounter (Signed)
Okay Not clear if there is anything pressing

## 2016-02-29 ENCOUNTER — Encounter: Payer: Self-pay | Admitting: Internal Medicine

## 2016-02-29 ENCOUNTER — Ambulatory Visit (INDEPENDENT_AMBULATORY_CARE_PROVIDER_SITE_OTHER): Payer: Managed Care, Other (non HMO) | Admitting: Internal Medicine

## 2016-02-29 VITALS — BP 124/84 | HR 81 | Temp 98.3°F | Wt 384.0 lb

## 2016-02-29 DIAGNOSIS — L739 Follicular disorder, unspecified: Secondary | ICD-10-CM | POA: Insufficient documentation

## 2016-02-29 DIAGNOSIS — I10 Essential (primary) hypertension: Secondary | ICD-10-CM | POA: Diagnosis not present

## 2016-02-29 DIAGNOSIS — R739 Hyperglycemia, unspecified: Secondary | ICD-10-CM | POA: Diagnosis not present

## 2016-02-29 LAB — GLUCOSE, RANDOM: Glucose, Bld: 151 mg/dL — ABNORMAL HIGH (ref 70–99)

## 2016-02-29 LAB — HEMOGLOBIN A1C: Hgb A1c MFr Bld: 7 % — ABNORMAL HIGH (ref 4.6–6.5)

## 2016-02-29 MED ORDER — DOXYCYCLINE HYCLATE 100 MG PO TABS: 100.0000 mg | ORAL_TABLET | Freq: Two times a day (BID) | ORAL | 2 refills | Status: DC

## 2016-02-29 NOTE — Progress Notes (Signed)
Subjective:    Patient ID: Troy Rogers, male    DOB: 05/15/1976, 40 y.o.   MRN: [id number]  HPI Here to review recent blood work---elevated glucose  Back is better Has returned to work about 3 weeks  [facility] Non fasting sugar 256 Feels tired--wonders if this is related Just doing the diet portion now--not considering the surgery as yet  Always thirsty ?some increased urination--but often less with increased sweating at work Nocturia up to 3 times a day  Has rash on chest and back since his back surgery Crusts and drains clear liquid Some itching  Current Outpatient Prescriptions on File Prior to Visit  Medication Sig Dispense Refill  . albuterol (PROVENTIL HFA;VENTOLIN HFA) 108 (90 Base) MCG/ACT inhaler Inhale 1-2 puffs into the lungs every 4 (four) hours as needed for wheezing or shortness of breath.    . allopurinol (ZYLOPRIM) 300 MG tablet Take 1 tablet (300 mg total) by mouth daily. 30 tablet 11  . carvedilol (COREG) 25 MG tablet Take 1 tablet (25 mg total) by mouth 2 (two) times daily with a meal. 60 tablet 11  . colchicine 0.6 MG tablet Take 0.6-1.2 mg by mouth See admin instructions. 1.2 mg at onset and then 0.6 mg twice daily    . DILT-XR 120 MG 24 hr capsule TAKE 1 CAPSULE(120 MG) BY MOUTH DAILY 30 capsule 3  . furosemide (LASIX) 40 MG tablet Take 1 tablet (40 mg total) by mouth daily as needed. 90 tablet 3  . lisinopril (PRINIVIL,ZESTRIL) 40 MG tablet Take 1 tablet (40 mg total) by mouth daily. 90 tablet 3  . methocarbamol (ROBAXIN) 500 MG tablet Take 500 mg by mouth every 6 (six) hours as needed for muscle spasms.    Marland Kitchen oxyCODONE-acetaminophen (PERCOCET/ROXICET) 5-325 MG tablet Take 1 tablet by mouth 3 (three) times daily as needed for severe pain.     Marland Kitchen Phenyleph-Doxylamine-DM-APAP (ALKA SELTZER PLUS PO) Take 1 tablet by mouth daily as needed (for heartburn).    . senna (SENOKOT) 8.6 MG TABS tablet Take 1 tablet (8.6 mg total) by mouth 2 (two)  times daily. (Patient not taking: Reported on 02/29/2016) 120 each 0  . [DISCONTINUED] cloNIDine (CATAPRES - DOSED IN MG/24 HR) 0.2 mg/24hr patch Place 1 patch (0.2 mg total) onto the skin once a week. (Patient not taking: Reported on 10/08/2014) 4 patch 12   No current facility-administered medications on file prior to visit.     Allergies  Allergen Reactions  . Penicillins Hives    Has patient had a PCN reaction causing immediate rash, facial/tongue/throat swelling, SOB or lightheadedness with hypotension: Yes Has patient had a PCN reaction causing severe rash involving mucus membranes or skin necrosis: No Has patient had a PCN reaction that required hospitalization No Has patient had a PCN reaction occurring within the last 10 years: No If all of the above answers are "NO", then may proceed with Cephalosporin use.     Past Medical History:  Diagnosis Date  . Cellulitis   . Gout   . Headache    hx migraines   . Hypertension   . Irritable bowel syndrome (IBS)   . Nephrolithiasis   . Obstructive sleep apnea    cpap     Past Surgical History:  Procedure Laterality Date  . STRABISMUS SURGERY  1983    Family History  Problem Relation Age of Onset  . Heart disease Mother   . COPD Mother   . Stroke Mother  TIA vs very mild  . COPD Father   . Heart attack Maternal Grandfather   . Heart attack Paternal Grandfather   . Cancer Maternal Grandmother     in throat  . Heart attack Maternal Grandmother   . Heart disease Paternal Grandmother     Social History   Social History  . Marital status: Divorced    Spouse name: N/A  . Number of children: 0  . Years of education: N/A   Occupational History  . Apartment maintenance    Social History Main Topics  . Smoking status: Never Smoker  . Smokeless tobacco: Never Used  . Alcohol use Yes     Comment: ocassionally   . Drug use: No  . Sexual activity: Not on file   Other Topics Concern  . Not on file   Social  History Narrative  . No narrative on file   Review of Systems Appetite is about the same Trying to cut portions down, etc    Objective:   Physical Exam  Constitutional: He appears well-developed and well-nourished. No distress.  Neck: Normal range of motion. No thyromegaly present.  Cardiovascular: Normal rate, regular rhythm and normal heart sounds.  Exam reveals no gallop.   No murmur heard. Pulmonary/Chest: Effort normal and breath sounds normal. No respiratory distress. He has no wheezes. He has no rales.  Lymphadenopathy:    He has no cervical adenopathy.  Skin:  Multiple red/crusted lesions on chest/shoulders          Assessment & Plan:

## 2016-02-29 NOTE — Assessment & Plan Note (Signed)
Non fasting over 200 Has had fasting of 119 last time checked here Discussed meds Will check FBS and A1c Plan to start metformin

## 2016-02-29 NOTE — Assessment & Plan Note (Signed)
BP Readings from Last 3 Encounters:  02/29/16 124/84  02/07/16 (!) 160/90  10/19/15 (!) 140/92   [name] today

## 2016-02-29 NOTE — Assessment & Plan Note (Signed)
Will try doxy course

## 2016-02-29 NOTE — Progress Notes (Signed)
Pre visit review using our clinic review tool, if applicable. No additional management support is needed unless otherwise documented below in the visit note. 

## 2016-03-01 ENCOUNTER — Encounter: Payer: Self-pay | Admitting: Internal Medicine

## 2016-03-01 ENCOUNTER — Other Ambulatory Visit: Payer: Self-pay | Admitting: Internal Medicine

## 2016-03-01 DIAGNOSIS — E119 Type 2 diabetes mellitus without complications: Secondary | ICD-10-CM

## 2016-03-01 MED ORDER — METFORMIN HCL 500 MG PO TABS: 500.0000 mg | ORAL_TABLET | Freq: Two times a day (BID) | ORAL | 3 refills | Status: DC

## 2016-04-24 ENCOUNTER — Ambulatory Visit: Payer: Managed Care, Other (non HMO) | Admitting: Internal Medicine

## 2016-04-25 ENCOUNTER — Other Ambulatory Visit: Payer: Self-pay | Admitting: Internal Medicine

## 2016-05-04 IMAGING — CT CT ABD-PELV W/ CM
1 of 2 series · 15 of 32 positions shown, 19 images · IV contrast (OMNIPAQUE 300)
Comparison: CT scan of November 06, 2008.

CLINICAL DATA: Acute right lower quadrant abdominal pain.

EXAM:
CT ABDOMEN AND PELVIS WITH CONTRAST
TECHNIQUE: Multidetector CT imaging of the abdomen and pelvis was performed
using the standard protocol following bolus administration of
intravenous contrast.
CONTRAST:  100mL OMNIPAQUE IOHEXOL 300 MG/ML  SOLN

[Series 2: abd/pel with · axial · 0.87mm/px · z∈[-469,-24]mm · 15 of 99 slices shown, 19 images]
[im 5/99  soft-tissue]
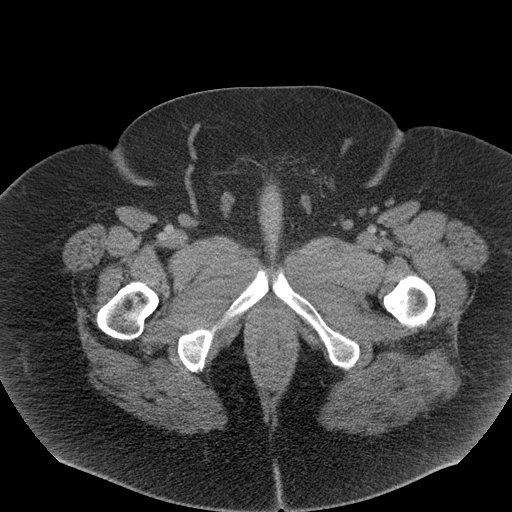
[im 5/99  bone]
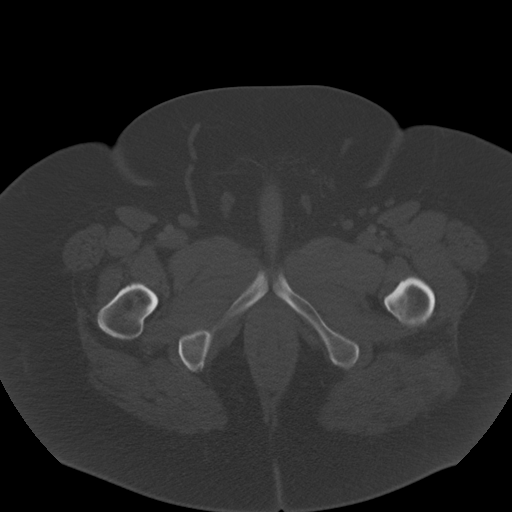
[im 13/99  soft-tissue]
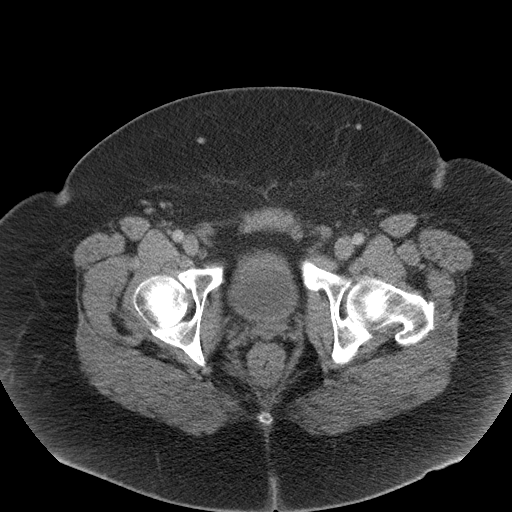
[im 22/99  soft-tissue]
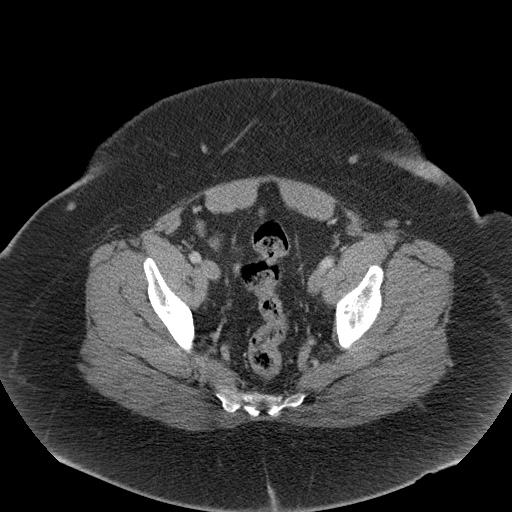
[im 26/99  soft-tissue]
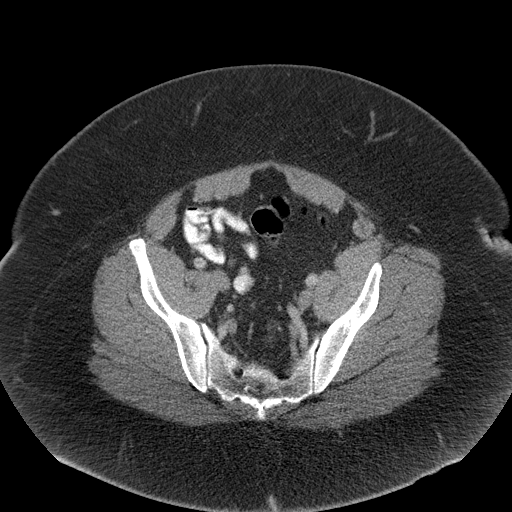
[im 35/99  soft-tissue]
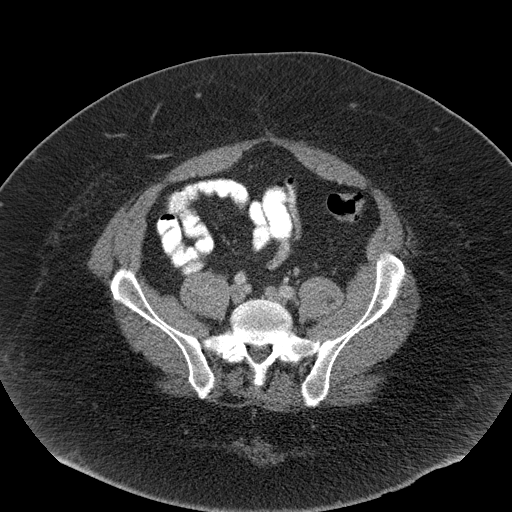
[im 43/99  soft-tissue]
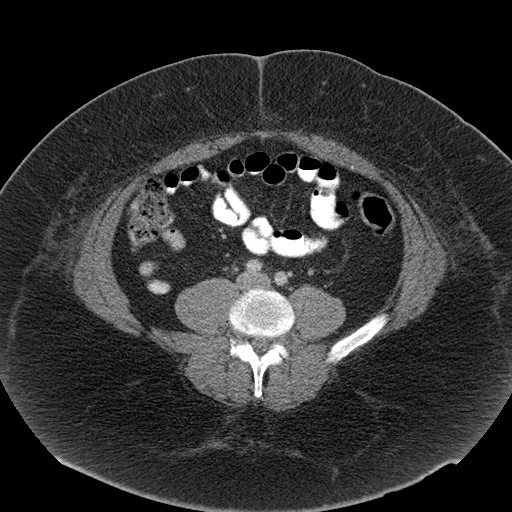
[im 52/99  soft-tissue]
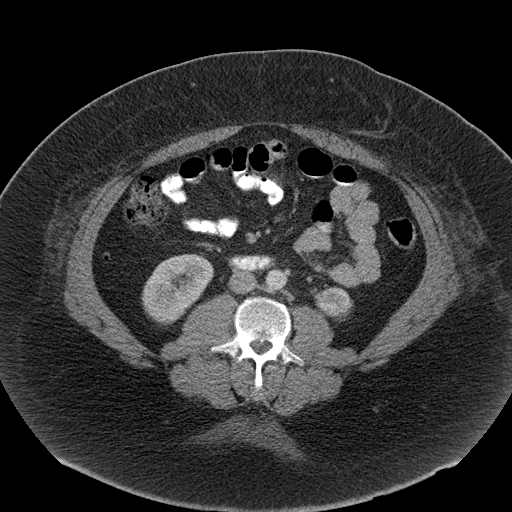
[im 56/99  soft-tissue]
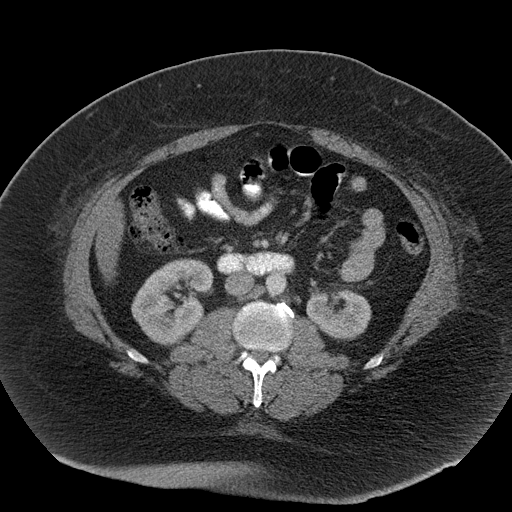
[im 64/99  soft-tissue]
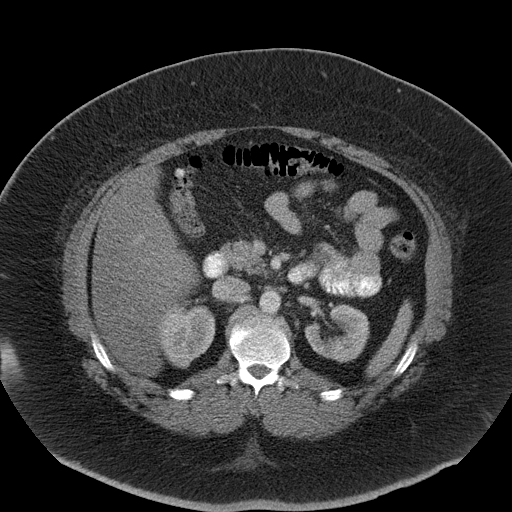
[im 64/99  bone]
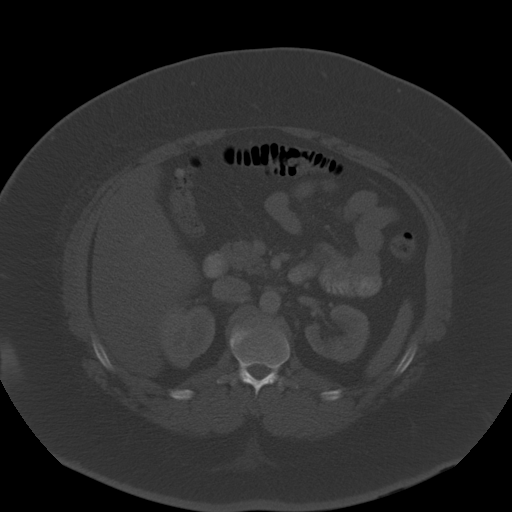
[im 73/99  soft-tissue]
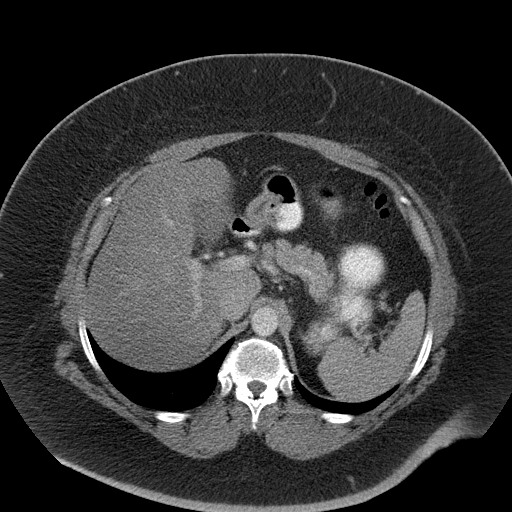
[im 77/99  soft-tissue]
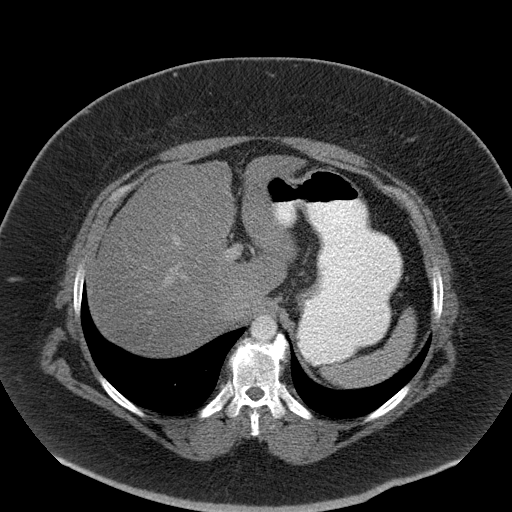
[im 81/99  lung]
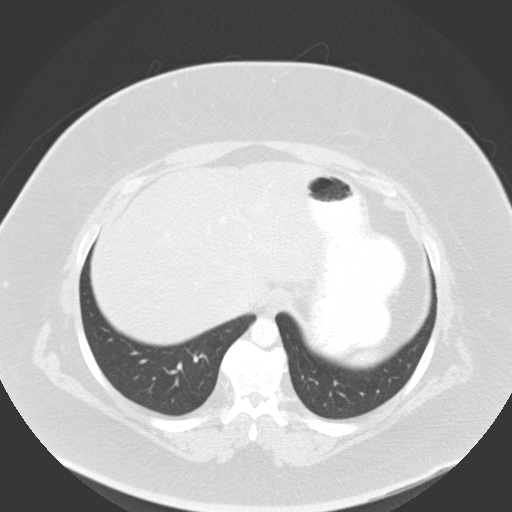
[im 86/99  soft-tissue]
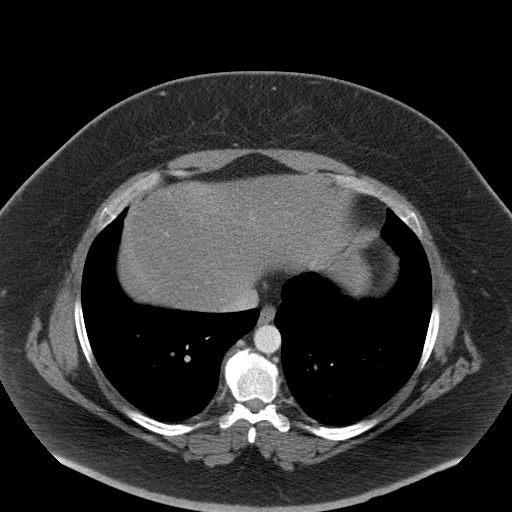
[im 86/99  lung]
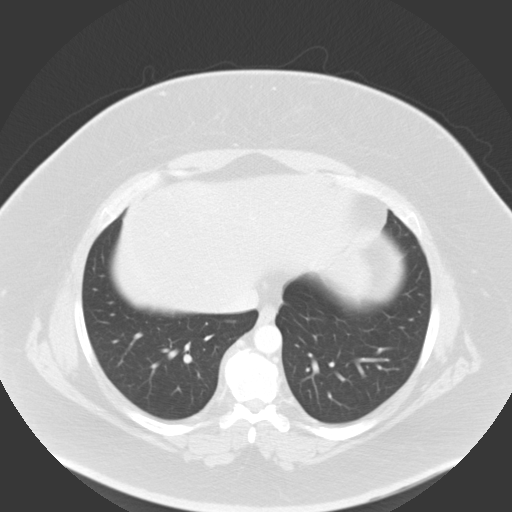
[im 90/99  lung]
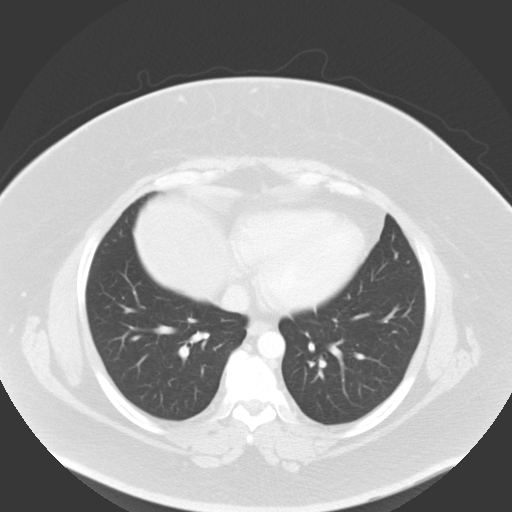
[im 94/99  soft-tissue]
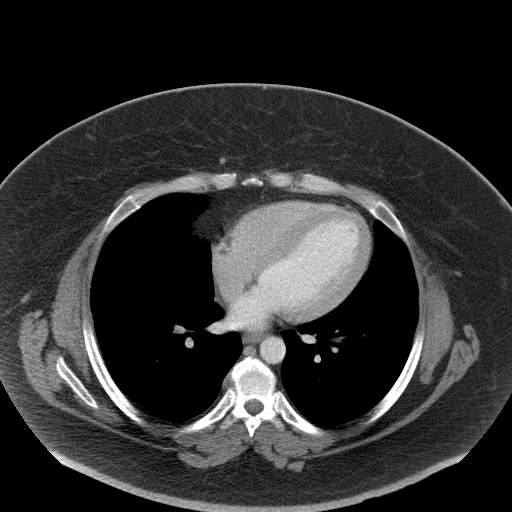
[im 94/99  lung]
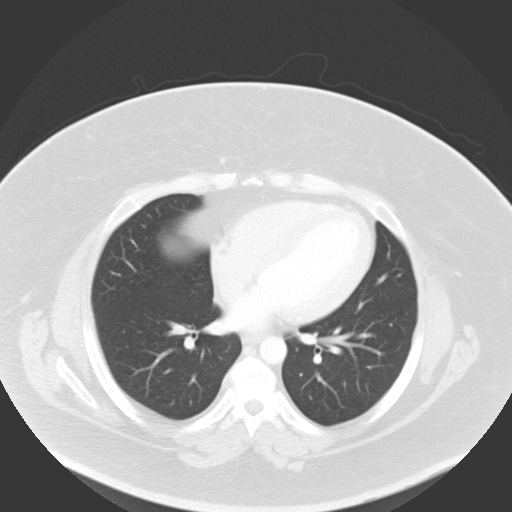

[15 of 32 positions shown; findings below may reference images not displayed]

FINDINGS: Bilateral pars defects are seen at L4. Visualized lung bases appear
normal.

Fatty infiltration of the liver is noted. No gallstones are noted.
Pancreas and spleen appear normal. Adrenal glands appear normal.
Bilateral nonobstructive nephrolithiasis is noted. No hydronephrosis
or renal obstruction is noted. No ureteral calculi are noted. The
appendix appears normal. There is no evidence of bowel obstruction.
No abnormal fluid collection is noted. Urinary bladder appears
normal. No significant adenopathy is noted.
IMPRESSION: Bilateral nonobstructive nephrolithiasis. No hydronephrosis or renal
obstruction is noted.

Fatty infiltration of the liver.

No other significant abnormality seen in the abdomen or pelvis.

## 2016-05-07 ENCOUNTER — Telehealth: Payer: Self-pay | Admitting: Internal Medicine

## 2016-05-07 NOTE — Telephone Encounter (Signed)
I will wait to see if needs anything from us since the note says he is waiting to hear back from his neurosurgeon as to what to do

## 2016-05-07 NOTE — Telephone Encounter (Signed)
Patient Name: Troy MorgansJOHN Rogers  DOB: Jul 29, 1975    Initial Comment caller states trying to get off pain meds, nees advice on what to do   Nurse Assessment  Nurse: Stefano GaulStringer, RN, Dwana CurdVera Date/Time Troy Rogers(Eastern Time): 05/07/2016 2:05:48 PM  Confirm and document reason for call. If symptomatic, describe symptoms. You must click the next button to save text entered. ---Caller states last year, he started taking hydrodocone by his neurosurgeon after surgery. He was prescribed hydrocodone after 3 kidney stones and a stent placement. He has been taking it over a year. He is wanting to stop taking the pain medication. He did not take it yesterday and he gets jittery and shaky.  Has the patient traveled out of the country within the last 30 days? ---Not Applicable  Does the patient have any new or worsening symptoms? ---Yes  Will a triage be completed? ---Yes  Related visit to physician within the last 2 weeks? ---No  Does the PT have any chronic conditions? (i.e. diabetes, asthma, etc.) ---Yes  List chronic conditions. ---kidney stones  Is this a behavioral health or substance abuse call? ---Yes  Are you having any thoughts or feelings of harming or killing yourself or someone else? ---No  Are you currently experiencing any physical discomfort that you think may be related to the use of alcohol or other drugs? (use substance abuse or alcohol abuse guidelines. These include withdrawal symptoms) ---Yes  Do you worry that you may be hearing or seeing things that others do not? ---Yes  Do you take medications for your condition(s)? ---No     Guidelines    Guideline Title Affirmed Question Affirmed Notes  Substance Abuse and Dependence Drug use interferes with work or school    Final Disposition User   See PCP When Office is Open (within 3 days) Stefano GaulStringer, Charity fundraiserN, Dwana CurdVera    Comments  Pt does not want to make an appt with Dr. Alphonsus SiasLetvak at this time. He has called his neurosurgeon and is waiting for a call back. He wants to  wait to see what his neurosurgeon says.  He did take 1/2 tab at lunch today.   Disagree/Comply: Comply

## 2016-05-07 NOTE — Telephone Encounter (Signed)
How frequently had he been taking this?

## 2016-05-07 NOTE — Telephone Encounter (Signed)
Dr Letvak out of office;Please advise.  

## 2016-07-02 DIAGNOSIS — M722 Plantar fascial fibromatosis: Secondary | ICD-10-CM

## 2016-07-02 DIAGNOSIS — M24572 Contracture, left ankle: Secondary | ICD-10-CM | POA: Insufficient documentation

## 2016-07-02 HISTORY — DX: Contracture, left ankle: M24.572

## 2016-07-02 HISTORY — DX: Plantar fascial fibromatosis: M72.2

## 2016-07-20 ENCOUNTER — Other Ambulatory Visit: Payer: Self-pay | Admitting: Internal Medicine

## 2016-08-27 ENCOUNTER — Other Ambulatory Visit: Payer: Self-pay | Admitting: Internal Medicine

## 2016-08-31 ENCOUNTER — Other Ambulatory Visit: Payer: Self-pay | Admitting: Internal Medicine

## 2016-09-13 ENCOUNTER — Other Ambulatory Visit: Payer: Self-pay | Admitting: Internal Medicine

## 2016-09-23 ENCOUNTER — Other Ambulatory Visit: Payer: Self-pay | Admitting: Internal Medicine

## 2016-10-15 ENCOUNTER — Other Ambulatory Visit: Payer: Self-pay | Admitting: Internal Medicine

## 2016-10-15 NOTE — Telephone Encounter (Signed)
Last OV 02/2016. Last Rx 09/13/2016. pls advise

## 2016-10-15 NOTE — Telephone Encounter (Signed)
Approved: okay for a year 

## 2016-10-26 ENCOUNTER — Other Ambulatory Visit: Payer: Self-pay | Admitting: Internal Medicine

## 2016-11-22 ENCOUNTER — Telehealth: Payer: Self-pay

## 2016-11-22 NOTE — Telephone Encounter (Signed)
Made an appt for 11-26-16 at 430pm.

## 2016-11-22 NOTE — Telephone Encounter (Signed)
Spoke to pt to see how he was doing. Troy SpanielSaid he is still coughing stuff up. Feeling a little better.

## 2016-11-26 ENCOUNTER — Ambulatory Visit (INDEPENDENT_AMBULATORY_CARE_PROVIDER_SITE_OTHER)
Admission: RE | Admit: 2016-11-26 | Discharge: 2016-11-26 | Disposition: A | Discharge status: Home / Self Care | Payer: Managed Care, Other (non HMO) | Source: Ambulatory Visit | Attending: Internal Medicine | Admitting: Internal Medicine

## 2016-11-26 ENCOUNTER — Encounter: Payer: Self-pay | Admitting: Internal Medicine

## 2016-11-26 ENCOUNTER — Ambulatory Visit (INDEPENDENT_AMBULATORY_CARE_PROVIDER_SITE_OTHER): Payer: Managed Care, Other (non HMO) | Admitting: Internal Medicine

## 2016-11-26 VITALS — BP 130/90 | HR 83 | Temp 98.3°F | Resp 14 | Wt 375.0 lb

## 2016-11-26 DIAGNOSIS — J189 Pneumonia, unspecified organism: Secondary | ICD-10-CM

## 2016-11-26 DIAGNOSIS — R0789 Other chest pain: Secondary | ICD-10-CM

## 2016-11-26 NOTE — Progress Notes (Signed)
Subjective:    Patient ID: Troy Rogers, male    DOB: [birth year], 41 y.o.   MRN: [id number]  HPI [facility]  Reviewed paperwork---no CXR report but diagnosis is atypical chest pain and right pneumonia  Started a week before the ER visit Off and on chest pain and numbness in fingers on left hand Then got worse---very tired and was napping frequently Boss noticed he was off  Left arm and chest pain worsened--- so went to ER from work the next day  EKG and labs okay Diagnosed with pneumonia  Took levaquin 750mg  for 7 days Did take off the rest of the week but back to work last week  Still doesn't feel right Body still hurts Chest pain recurred some 2 days after the ER visit--had to rest on the job Again 3 days ago-- had some pain Only occasional cough--slight mucus Fever a few days after ER visit-- did get better Awakens with SOB and chest pain--- also trouble when really hot  Current Outpatient Prescriptions on File Prior to Visit  Medication Sig Dispense Refill  . allopurinol (ZYLOPRIM) 300 MG tablet TAKE 1 TABLET(300 MG) BY MOUTH DAILY 30 tablet 2  . carvedilol (COREG) 25 MG tablet TAKE 1 TABLET(25 MG) BY MOUTH TWICE DAILY WITH A MEAL 180 tablet 3  . colchicine 0.6 MG tablet Take 0.6-1.2 mg by mouth See admin instructions. 1.2 mg at onset and then 0.6 mg twice daily    . DILT-XR 120 MG 24 hr capsule TAKE 1 CAPSULE(120 MG) BY MOUTH DAILY 90 capsule 0  . furosemide (LASIX) 40 MG tablet Take 1 tablet (40 mg total) by mouth daily as needed. 90 tablet 3  . lisinopril (PRINIVIL,ZESTRIL) 40 MG tablet TAKE 1 TABLET(40 MG) BY MOUTH DAILY 90 tablet 0  . metFORMIN (GLUCOPHAGE) 500 MG tablet Take 1 tablet (500 mg total) by mouth 2 (two) times daily with a meal. 180 tablet 3  . methocarbamol (ROBAXIN) 500 MG tablet Take 500 mg by mouth every 6 (six) hours as needed for muscle spasms.    . Phenyleph-Doxylamine-DM-APAP (ALKA SELTZER PLUS PO) Take 1 tablet by  mouth daily as needed (for heartburn).    . senna (SENOKOT) 8.6 MG TABS tablet Take 1 tablet (8.6 mg total) by mouth 2 (two) times daily. 120 each 0  . [DISCONTINUED] cloNIDine (CATAPRES - DOSED IN MG/24 HR) 0.2 mg/24hr patch Place 1 patch (0.2 mg total) onto the skin once a week. (Patient not taking: Reported on 10/08/2014) 4 patch 12   No current facility-administered medications on file prior to visit.     Allergies  Allergen Reactions  . Penicillins Hives    Has patient had a PCN reaction causing immediate rash, facial/tongue/throat swelling, SOB or lightheadedness with hypotension: Yes Has patient had a PCN reaction causing severe rash involving mucus membranes or skin necrosis: No Has patient had a PCN reaction that required hospitalization No Has patient had a PCN reaction occurring within the last 10 years: No If all of the above answers are "NO", then may proceed with Cephalosporin use.     Past Medical History:  Diagnosis Date  . Cellulitis   . Gout   . Headache    hx migraines   . Hypertension   . Irritable bowel syndrome (IBS)   . Nephrolithiasis   . Newly diagnosed diabetes (HCC)   . Obstructive sleep apnea    cpap     Past Surgical History:  Procedure Laterality Date  . STRABISMUS SURGERY  1983    Family History  Problem Relation Age of Onset  . Heart disease Mother   . COPD Mother   . Stroke Mother        TIA vs very mild  . COPD Father   . Heart attack Maternal Grandfather   . Heart attack Paternal Grandfather   . Cancer Maternal Grandmother        in throat  . Heart attack Maternal Grandmother   . Heart disease Paternal Grandmother     Social History   Social History  . Marital status: Married    Spouse name: N/A  . Number of children: 0  . Years of education: N/A   Occupational History  . Apartment maintenance    Social History Main Topics  . Smoking status: Never Smoker  . Smokeless tobacco: Never Used  . Alcohol use Yes      Comment: ocassionally   . Drug use: No  . Sexual activity: Not on file   Other Topics Concern  . Not on file   Social History Narrative   Remarried June 2017   Review of Systems  Occasional racing heart Some diarrhea depending on what he eats Thirsty while on the antibiotic Appetite is off     Objective:   Physical Exam  Constitutional: No distress.  Looks washed out  Neck: No thyromegaly present.  Cardiovascular: Normal rate, regular rhythm and normal heart sounds.  Exam reveals no gallop and no friction rub.   No murmur heard. Pulmonary/Chest: Effort normal and breath sounds normal. No respiratory distress. He has no wheezes. He has no rales.  Some upper left chest tenderness  Lymphadenopathy:    He has no cervical adenopathy.          Assessment & Plan:

## 2016-11-26 NOTE — Assessment & Plan Note (Signed)
Overall fatigue Only gets the pain when active Has some tenderness---but not clear that this is not ischemic Will set up with cardiologist

## 2016-11-26 NOTE — Assessment & Plan Note (Addendum)
CXR report not available Had left sided pain and left arm pain Finally got report from [place]--just RLL atelectasis CXR today looks okay--will await overread Had 7 days of high dose levaquin--no more Rx needed

## 2016-12-10 ENCOUNTER — Other Ambulatory Visit: Payer: Self-pay | Admitting: Internal Medicine

## 2016-12-11 ENCOUNTER — Encounter: Payer: Self-pay | Admitting: Cardiology

## 2016-12-11 ENCOUNTER — Ambulatory Visit (INDEPENDENT_AMBULATORY_CARE_PROVIDER_SITE_OTHER): Payer: Managed Care, Other (non HMO) | Admitting: Cardiology

## 2016-12-11 VITALS — BP 124/84 | HR 89 | Ht 66.6 in | Wt 378.8 lb

## 2016-12-11 DIAGNOSIS — G4733 Obstructive sleep apnea (adult) (pediatric): Secondary | ICD-10-CM

## 2016-12-11 DIAGNOSIS — R0789 Other chest pain: Secondary | ICD-10-CM

## 2016-12-11 DIAGNOSIS — I10 Essential (primary) hypertension: Secondary | ICD-10-CM | POA: Diagnosis not present

## 2016-12-11 NOTE — Progress Notes (Signed)
Electrophysiology Office Note   Date:  12/11/2016   ID:  Troy Rogers, DOB May 26, 1976, MRN [id number]  PCP:  Karie Schwalbe, MD Primary Electrophysiologist:  Regan Lemming, MD    Chief Complaint  Patient presents with  . New Patient (Initial Visit)    Atypical chest pain     History of Present Illness: Troy Rogers is a 41 y.o. male who is being seen today for the evaluation of chest pain at the request of Karie Schwalbe, MD. Presenting today for electrophysiology evaluation. He has been having chest pain for approximately a month and a half. He did present to the emergency room at [facility] approximately one month ago. He says that he was having the chest pain for approximately a day, maybe longer. Per the patient, he had EKGs done as well as blood testing that showed no evidence of cardiac ischemia. He is continued to have chest pain since that time. The pain lasts for days at a time. He is unclear if it worsens with exertion and improves with rest. Over this past weekend, he walked around the mall quite a bit with his wife and had no chest pain or discomfort.    Today, he denies symptoms of palpitations, lower extremity edema, claudication, dizziness, presyncope, syncope, bleeding, or neurologic sequela. The patient is tolerating medications without difficulties.    Past Medical History:  Diagnosis Date  . Cellulitis   . Gout   . Headache    hx migraines   . Hypertension   . Irritable bowel syndrome (IBS)   . Nephrolithiasis   . Newly diagnosed diabetes (HCC)   . Newly diagnosed diabetes (HCC)   . Obstructive sleep apnea    cpap    Past Surgical History:  Procedure Laterality Date  . STRABISMUS SURGERY  1983     Current Outpatient Prescriptions  Medication Sig Dispense Refill  . allopurinol (ZYLOPRIM) 300 MG tablet TAKE 1 TABLET(300 MG) BY MOUTH DAILY 30 tablet 2  . carvedilol (COREG) 25 MG tablet TAKE 1 TABLET(25 MG) BY MOUTH TWICE DAILY  WITH A MEAL 180 tablet 3  . colchicine 0.6 MG tablet Take 0.6-1.2 mg by mouth See admin instructions. 1.2 mg at onset and then 0.6 mg twice daily    . DILT-XR 120 MG 24 hr capsule TAKE 1 CAPSULE(120 MG) BY MOUTH DAILY 90 capsule 0  . furosemide (LASIX) 40 MG tablet Take 1 tablet (40 mg total) by mouth daily as needed. 90 tablet 3  . lisinopril (PRINIVIL,ZESTRIL) 40 MG tablet TAKE 1 TABLET(40 MG) BY MOUTH DAILY 90 tablet 0  . methocarbamol (ROBAXIN) 500 MG tablet Take 500 mg by mouth every 6 (six) hours as needed for muscle spasms.     No current facility-administered medications for this visit.     Allergies:   Penicillins   Social History:  The patient  reports that he has never smoked. He has never used smokeless tobacco. He reports that he drinks alcohol. He reports that he does not use drugs.   Family History:  The patient's family history includes COPD in his father and mother; Cancer in his maternal grandmother; Heart attack in his maternal grandfather, maternal grandmother, and paternal grandfather; Heart disease in his mother and paternal grandmother; Stroke in his mother.    ROS:  Please see the history of present illness.   Otherwise, review of systems is positive for appetite change, chills, fatigue, chest pain, leg swelling, dyspnea on exertion, shortness of  breath lying flat, waking up short of breath, chest pressure, leg pain, snoring, wheezing, cough, nausea, anxiety, back pain, muscle pain, joint swelling, balance problems, dizziness, headaches.   All other systems are reviewed and negative.    PHYSICAL EXAM: VS:  BP 124/84   Pulse 89   Ht 5' 6.6" (1.692 m)   Wt (!) 378 lb 12.8 oz (171.8 kg)   BMI 60.04 kg/m  , BMI Body mass index is 60.04 kg/m. GEN: Well nourished, well developed, in no acute distress  HEENT: normal  Neck: no JVD, carotid bruits, or masses Cardiac: RRR; no murmurs, rubs, or gallops,no edema  Respiratory:  clear to auscultation bilaterally, normal work  of breathing GI: soft, nontender, nondistended, + BS MS: no deformity or atrophy  Skin: warm and dry Neuro:  Strength and sensation are intact Psych: euthymic mood, full affect  EKG:  EKG is ordered today. Personal review of the ekg ordered shows sinus rhythm, first-degree AV block  Recent Labs: No results found for requested labs within last 8760 hours.    Lipid Panel     Component Value Date/Time   CHOL 144 01/22/2014 0409   TRIG 58 01/22/2014 0409   HDL 45 01/22/2014 0409   CHOLHDL 3.2 01/22/2014 0409   VLDL 12 01/22/2014 0409   LDLCALC 87 01/22/2014 0409     Wt Readings from Last 3 Encounters:  12/11/16 (!) 378 lb 12.8 oz (171.8 kg)  11/26/16 (!) 375 lb (170.1 kg)  02/29/16 (!) 384 lb (174.2 kg)      Other studies Reviewed: Additional studies/ records that were reviewed today include: TTE 2015  Review of the above records today demonstrates:  - Left ventricle: The cavity size was normal. There was mild concentric hypertrophy. Systolic function was normal. The estimated ejection fraction was in the range of 55% to 60%. Although no diagnostic regional wall motion abnormality was identified, this possibility cannot be completely excluded on the basis of this study. Doppler parameters are consistent with abnormal left ventricular relaxation (grade 1 diastolic dysfunction). - Left atrium: The atrium was moderately dilated.   ASSESSMENT AND PLAN:  1.  Chest pain: It is unclear as to the cause of his chest pain. It is encouraging that he had pain for greater than 24 hours with negative lab results and EKGs, per the patient. His pain is quite atypical in nature. At this point, I do not feel that any further testing is necessary. If his chest pain changes in nature, would plan for stress testing. I have also told him that if he feels that he will get peace of mind with stress testing, but I would be happy to order the study.  2. Hypertension:  Well-controlled today  3. OSA: encouraged CPAP compliance  Current medicines are reviewed at length with the patient today.   The patient does not have concerns regarding his medicines.  The following changes were made today:  none  Labs/ tests ordered today include:  Orders Placed This Encounter  Procedures  . EKG 12-Lead     Disposition:   FU with Will Camnitz PRN  month  Signed, Will Jorja LoaMartin Camnitz, MD  12/11/2016 3:50 PM     [facility] [address] [place] [address] [phone] (office) [phone] (fax)

## 2016-12-11 NOTE — Patient Instructions (Signed)
Medication Instructions:  Your physician recommends that you continue on your current medications as directed. Please refer to the Current Medication list given to you today.  * If you need a refill on your cardiac medications before your next appointment, please call your pharmacy.   Labwork: None ordered  Testing/Procedures: None ordered  Follow-Up: No follow up is needed at this time with Dr. Camnitz.  He will see you on an as needed basis.   Thank you for choosing [facility]!!   Myron Stankovich, RN [phone]     

## 2017-01-06 ENCOUNTER — Other Ambulatory Visit: Payer: Self-pay | Admitting: Internal Medicine

## 2017-02-04 ENCOUNTER — Other Ambulatory Visit: Payer: Self-pay | Admitting: Internal Medicine

## 2017-02-08 ENCOUNTER — Telehealth: Payer: Self-pay

## 2017-02-08 NOTE — Telephone Encounter (Signed)
PLEASE NOTE: All timestamps contained within this report are represented as [place] Standard Time. CONFIDENTIALTY NOTICE: This fax transmission is intended only for the addressee. It contains information that is legally privileged, confidential or otherwise protected from use or disclosure. If you are not the intended recipient, you are strictly prohibited from reviewing, disclosing, copying using or disseminating any of this information or taking any action in reliance on or regarding this information. If you have received this fax in error, please notify us immediately by telephone so that we can arrange for its return to [place]. Phone: [phone], Toll-Free: [phone], Fax: [phone] Page: 1 of 1 Call Id: [id number] [facility] Night - Client TELEPHONE ADVICE RECORD [facility] Patient Name: Troy Rogers Gender: Male DOB: 1976-05-11 Age: 78 Y 1 M 18 D Return Phone Number: [phone] (Primary), [phone] (Secondary) City/State/Zip: [name] [address] Client [place] [facility] Night - Client Client Site  [facility] [place] - Night Physician Tillman Abide - MD Who Is Calling Patient / Member / Family / Caregiver Call Type Triage / Clinical Relationship To Patient Self Return Phone Number [phone] (Primary) Chief Complaint Anxiety and Panic Attack Reason for Call Symptomatic / Request for Health Information Initial Comment Caller states that he needs an appt; he is having anxiety symptoms that are intermittent Nurse Assessment Nurse: Doylene Canard, RN, Lesa Date/Time (Eastern Time): 02/07/2017 5:17:39 PM Confirm and document reason for call. If symptomatic, describe symptoms. ---Caller states he has anxiety Does the PT have any chronic conditions? (i.e. diabetes, asthma, etc.) ---No Guidelines Guideline Title Affirmed Question Anxiety and Panic Attack Patient sounds very upset or troubled to the  triager Disp. Time Lamount Cohen Time) Disposition Final User 02/07/2017 5:22:54 PM See Physician within 24 Hours Yes Conner, RN, Lesa Referrals REFERRED TO PCP OFFICE Care Advice Given Per Guideline SEE PHYSICIAN WITHIN 24 HOURS: * IF OFFICE WILL BE OPEN: You need to be seen within the next 24 hours. Call your doctor when the office opens, and make an appointment. HEALTHY LIFESTYLE BASICS: * Sleep - Try to get sufficient amount of sleep. Most people need 7-8 hours of sleep each night. * Regular exercise will improve your overall health, improve your mood, and is a simple method to reduce stress. * Eat a balanced healthy diet. * Drink adequate liquids - 6-8 glasses of water daily. AVOID TRIGGERS OF ANXIETY: * Avoid diet pills (Reason: they act as stimulants.) * Talk to your doctor before taking any herbal supplements (Reason: some have side effects) CALL BACK IF: * You feel like harming yourself * You become worse. CARE ADVICE given per Anxiety and Panic Attack (Adult) guideline.

## 2017-02-08 NOTE — Telephone Encounter (Signed)
Noted,thanks. Routed to PCP as FYI.  

## 2017-02-08 NOTE — Telephone Encounter (Signed)
Spoke to pt's wife. Made appt 02-14-17

## 2017-02-08 NOTE — Telephone Encounter (Signed)
I spoke with pt and Dr Alphonsus Sias is out of office and no available appt until 02/14/17. Offered pt appt today at another [facility] site but pt said appt on Mon was OK. No SI/HI.pt scheduled 30' appt on 02/11/17 at 11:45 with Dr Para March. If pt condition changes over weekend will go to [facility]. FYI to Dr Para March.

## 2017-02-11 ENCOUNTER — Encounter: Payer: Self-pay | Admitting: Family Medicine

## 2017-02-11 ENCOUNTER — Ambulatory Visit (INDEPENDENT_AMBULATORY_CARE_PROVIDER_SITE_OTHER): Payer: Managed Care, Other (non HMO) | Admitting: Family Medicine

## 2017-02-11 DIAGNOSIS — F410 Panic disorder [episodic paroxysmal anxiety] without agoraphobia: Secondary | ICD-10-CM

## 2017-02-11 MED ORDER — HYDROXYZINE HCL 10 MG PO TABS: 10.0000 mg | ORAL_TABLET | Freq: Three times a day (TID) | ORAL | 0 refills | Status: DC | PRN

## 2017-02-11 NOTE — Telephone Encounter (Signed)
Apparently was seen today Will await the evaluation

## 2017-02-11 NOTE — Patient Instructions (Signed)
Try atarax if needed for anxiety.  Update [place] in a few day.   Take care.  Glad to see you.

## 2017-02-11 NOTE — Progress Notes (Signed)
Anxiety and panic sx.  "I've been having anxiety attacks, I'll be fine and then all of a sudden I'll have to walk away from what I'm doing."  Going on for about 1 year, worse in the meantime.  Worse after his back surgery, "my nerves have been tore up since then."  Working with apartment maintenance.  More irritable.  No SI/HI.  He has OSA on CPAP.  He doesn't have prev dx of anxiety.  Remarried, last year.  They have been together for about 3.5 years.  "jittery."  No pain meds/opiates.  No etoh, no illicts, no tob.  He is in the midst of moving and that is a noted stressor.    Meds, vitals, and allergies reviewed.   ROS: Per HPI unless specifically indicated in ROS section   GEN: nad, alert and oriented, speech and judgement wnl.   HEENT: mucous membranes moist NECK: supple w/o LA CV: rrr PULM: ctab, no inc wob ABD: soft, +bs EXT: trace BLE edema He fidgets and bounces his leg during the conversation.

## 2017-02-12 DIAGNOSIS — F419 Anxiety disorder, unspecified: Secondary | ICD-10-CM | POA: Insufficient documentation

## 2017-02-12 NOTE — Assessment & Plan Note (Signed)
No SI/HI, okay for outpatient f/u.  Start hydroxyzine prn and update [place] if not better.  He may end up needing prophylactic med but wouldn't start at this point.  Rationale d/w pt.  He agrees.  He may benefit from counseling in the future.

## 2017-02-14 ENCOUNTER — Ambulatory Visit: Payer: Managed Care, Other (non HMO) | Admitting: Internal Medicine

## 2017-03-14 ENCOUNTER — Other Ambulatory Visit: Payer: Self-pay | Admitting: Internal Medicine

## 2017-04-16 ENCOUNTER — Other Ambulatory Visit: Payer: Self-pay | Admitting: Internal Medicine

## 2017-04-16 ENCOUNTER — Other Ambulatory Visit: Payer: Self-pay | Admitting: Family Medicine

## 2017-05-12 ENCOUNTER — Other Ambulatory Visit: Payer: Self-pay | Admitting: Internal Medicine

## 2017-06-19 ENCOUNTER — Other Ambulatory Visit: Payer: Self-pay | Admitting: Internal Medicine

## 2017-06-19 DIAGNOSIS — J209 Acute bronchitis, unspecified: Secondary | ICD-10-CM | POA: Diagnosis not present

## 2017-07-21 DIAGNOSIS — J209 Acute bronchitis, unspecified: Secondary | ICD-10-CM | POA: Diagnosis not present

## 2017-07-21 DIAGNOSIS — J019 Acute sinusitis, unspecified: Secondary | ICD-10-CM | POA: Diagnosis not present

## 2017-07-21 DIAGNOSIS — J111 Influenza due to unidentified influenza virus with other respiratory manifestations: Secondary | ICD-10-CM | POA: Diagnosis not present

## 2017-07-25 DIAGNOSIS — J111 Influenza due to unidentified influenza virus with other respiratory manifestations: Secondary | ICD-10-CM | POA: Diagnosis not present

## 2017-07-25 DIAGNOSIS — J069 Acute upper respiratory infection, unspecified: Secondary | ICD-10-CM | POA: Diagnosis not present

## 2017-08-04 DIAGNOSIS — R05 Cough: Secondary | ICD-10-CM | POA: Diagnosis not present

## 2017-08-04 DIAGNOSIS — Z0389 Encounter for observation for other suspected diseases and conditions ruled out: Secondary | ICD-10-CM | POA: Diagnosis not present

## 2017-08-04 DIAGNOSIS — J209 Acute bronchitis, unspecified: Secondary | ICD-10-CM | POA: Diagnosis not present

## 2017-08-16 DIAGNOSIS — H40053 Ocular hypertension, bilateral: Secondary | ICD-10-CM | POA: Diagnosis not present

## 2017-08-16 DIAGNOSIS — H40013 Open angle with borderline findings, low risk, bilateral: Secondary | ICD-10-CM | POA: Diagnosis not present

## 2017-08-20 DIAGNOSIS — N3020 Other chronic cystitis without hematuria: Secondary | ICD-10-CM | POA: Diagnosis not present

## 2017-08-20 DIAGNOSIS — N200 Calculus of kidney: Secondary | ICD-10-CM | POA: Diagnosis not present

## 2017-08-30 DIAGNOSIS — N201 Calculus of ureter: Secondary | ICD-10-CM | POA: Diagnosis not present

## 2017-08-30 DIAGNOSIS — I10 Essential (primary) hypertension: Secondary | ICD-10-CM | POA: Diagnosis not present

## 2017-08-30 DIAGNOSIS — G4730 Sleep apnea, unspecified: Secondary | ICD-10-CM | POA: Diagnosis not present

## 2017-08-30 DIAGNOSIS — E669 Obesity, unspecified: Secondary | ICD-10-CM | POA: Diagnosis not present

## 2017-08-30 DIAGNOSIS — Z6841 Body Mass Index (BMI) 40.0 and over, adult: Secondary | ICD-10-CM | POA: Diagnosis not present

## 2017-08-30 DIAGNOSIS — N200 Calculus of kidney: Secondary | ICD-10-CM | POA: Diagnosis not present

## 2017-09-06 DIAGNOSIS — N200 Calculus of kidney: Secondary | ICD-10-CM | POA: Diagnosis not present

## 2017-09-06 DIAGNOSIS — N3020 Other chronic cystitis without hematuria: Secondary | ICD-10-CM | POA: Diagnosis not present

## 2017-09-16 ENCOUNTER — Other Ambulatory Visit: Payer: Self-pay | Admitting: Internal Medicine

## 2017-09-27 DIAGNOSIS — H40021 Open angle with borderline findings, high risk, right eye: Secondary | ICD-10-CM | POA: Diagnosis not present

## 2017-09-27 DIAGNOSIS — H40012 Open angle with borderline findings, low risk, left eye: Secondary | ICD-10-CM | POA: Diagnosis not present

## 2017-10-15 DIAGNOSIS — N3020 Other chronic cystitis without hematuria: Secondary | ICD-10-CM | POA: Diagnosis not present

## 2017-10-15 DIAGNOSIS — N200 Calculus of kidney: Secondary | ICD-10-CM | POA: Diagnosis not present

## 2017-10-28 ENCOUNTER — Other Ambulatory Visit: Payer: Self-pay | Admitting: Internal Medicine

## 2017-12-01 ENCOUNTER — Other Ambulatory Visit: Payer: Self-pay | Admitting: Internal Medicine

## 2017-12-29 ENCOUNTER — Other Ambulatory Visit: Payer: Self-pay | Admitting: Internal Medicine

## 2018-01-23 ENCOUNTER — Encounter: Payer: Self-pay | Admitting: Internal Medicine

## 2018-01-23 ENCOUNTER — Ambulatory Visit: Payer: BLUE CROSS/BLUE SHIELD | Admitting: Internal Medicine

## 2018-01-23 VITALS — BP 132/80 | HR 81 | Temp 98.6°F | Ht 67.0 in | Wt 377.0 lb

## 2018-01-23 DIAGNOSIS — I10 Essential (primary) hypertension: Secondary | ICD-10-CM | POA: Diagnosis not present

## 2018-01-23 DIAGNOSIS — F419 Anxiety disorder, unspecified: Secondary | ICD-10-CM | POA: Diagnosis not present

## 2018-01-23 DIAGNOSIS — E119 Type 2 diabetes mellitus without complications: Secondary | ICD-10-CM

## 2018-01-23 LAB — POCT GLYCOSYLATED HEMOGLOBIN (HGB A1C): HEMOGLOBIN A1C: 7.3 % — AB (ref 4.0–5.6)

## 2018-01-23 LAB — HM DIABETES FOOT EXAM

## 2018-01-23 MED ORDER — HYDROXYZINE HCL 10 MG PO TABS: ORAL_TABLET | ORAL | 1 refills | Status: DC

## 2018-01-23 MED ORDER — METFORMIN HCL ER 500 MG PO TB24: 1000.0000 mg | ORAL_TABLET | Freq: Every day | ORAL | 3 refills | Status: DC

## 2018-01-23 NOTE — Assessment & Plan Note (Signed)
Has done well with the hydroxyzine

## 2018-01-23 NOTE — Assessment & Plan Note (Addendum)
Will check A1c  Really needs to start metformin Lab Results  Component Value Date   HGBA1C 7.3 (A) 01/23/2018   Will start metformin now

## 2018-01-23 NOTE — Assessment & Plan Note (Signed)
BP Readings from Last 3 Encounters:  01/23/18 132/80  02/11/17 (!) 150/82  12/11/16 124/84   Good control Continue lisinopril

## 2018-01-23 NOTE — Progress Notes (Signed)
Subjective:    Patient ID: Troy Rogers, male    DOB: 1975-12-15, 42 y.o.   MRN: [id number]  HPI Here for follow up of HTN and other chronic health conditions  Discussed the diabetes Never started the metformin--clearly some denial They have started eating healthier Weight is stanble No foot numbness or sores Due for eye exam next week---has been checked since the diagnosis  BP has been good--checks it periodically No chest pain or SOB No dizziness or syncope No edema  Still gets anxiety attacks  The hydroxyzine does help  Current Outpatient Medications on File Prior to Visit  Medication Sig Dispense Refill  . allopurinol (ZYLOPRIM) 300 MG tablet TAKE 1 TABLET(300 MG) BY MOUTH DAILY 30 tablet 0  . carvedilol (COREG) 25 MG tablet TAKE 1 TABLET(25 MG) BY MOUTH TWICE DAILY WITH A MEAL 180 tablet 3  . colchicine 0.6 MG tablet Take 0.6-1.2 mg by mouth See admin instructions. 1.2 mg at onset and then 0.6 mg twice daily    . DILT-XR 120 MG 24 hr capsule TAKE 1 CAPSULE(120 MG) BY MOUTH DAILY 30 capsule 0  . furosemide (LASIX) 40 MG tablet Take 1 tablet (40 mg total) by mouth daily as needed. 90 tablet 3  . hydrOXYzine (ATARAX/VISTARIL) 10 MG tablet TAKE 1 TABLET(10 MG) BY MOUTH THREE TIMES DAILY AS NEEDED 30 tablet 0  . lisinopril (PRINIVIL,ZESTRIL) 40 MG tablet TAKE 1 TABLET(40 MG) BY MOUTH DAILY 30 tablet 0  . [DISCONTINUED] cloNIDine (CATAPRES - DOSED IN MG/24 HR) 0.2 mg/24hr patch Place 1 patch (0.2 mg total) onto the skin once a week. (Patient not taking: Reported on 10/08/2014) 4 patch 12   No current facility-administered medications on file prior to visit.     Allergies  Allergen Reactions  . Penicillins Hives    Has patient had a PCN reaction causing immediate rash, facial/tongue/throat swelling, SOB or lightheadedness with hypotension: Yes Has patient had a PCN reaction causing severe rash involving mucus membranes or skin necrosis: No Has patient had a PCN reaction that  required hospitalization No Has patient had a PCN reaction occurring within the last 10 years: No If all of the above answers are "NO", then may proceed with Cephalosporin use.     Past Medical History:  Diagnosis Date  . Cellulitis   . Gout   . Headache    hx migraines   . Hypertension   . Irritable bowel syndrome (IBS)   . Nephrolithiasis   . Newly diagnosed diabetes (HCC)   . Newly diagnosed diabetes (HCC)   . Obstructive sleep apnea    cpap     Past Surgical History:  Procedure Laterality Date  . STRABISMUS SURGERY  1983    Family History  Problem Relation Age of Onset  . Heart disease Mother   . COPD Mother   . Stroke Mother        TIA vs very mild  . COPD Father   . Heart attack Maternal Grandfather   . Heart attack Paternal Grandfather   . Cancer Maternal Grandmother        in throat  . Heart attack Maternal Grandmother   . Heart disease Paternal Grandmother     Social History   Socioeconomic History  . Marital status: Married    Spouse name: Not on file  . Number of children: 0  . Years of education: Not on file  . Highest education level: Not on file  Occupational History  . Occupation: Biomedical scientistApartment  maintenance  Social Needs  . Financial resource strain: Not on file  . Food insecurity:    Worry: Not on file    Inability: Not on file  . Transportation needs:    Medical: Not on file    Non-medical: Not on file  Tobacco Use  . Smoking status: Never Smoker  . Smokeless tobacco: Never Used  Substance and Sexual Activity  . Alcohol use: Yes    Comment: ocassionally   . Drug use: No  . Sexual activity: Not on file  Lifestyle  . Physical activity:    Days per week: Not on file    Minutes per session: Not on file  . Stress: Not on file  Relationships  . Social connections:    Talks on phone: Not on file    Gets together: Not on file    Attends religious service: Not on file    Active member of club or organization: Not on file    Attends  meetings of clubs or organizations: Not on file    Relationship status: Not on file  . Intimate partner violence:    Fear of current or ex partner: Not on file    Emotionally abused: Not on file    Physically abused: Not on file    Forced sexual activity: Not on file  Other Topics Concern  . Not on file  Social History Narrative   Remarried June 2017   Review of Systems On call every other week--- often doesn't sleep well then Bowels have been okay Sleeps with CPAP ---most nights (occasionally winds up taking it off at night)    Objective:   Physical Exam  Constitutional: He appears well-developed. No distress.  Neck: No thyromegaly present.  Cardiovascular: Normal rate, regular rhythm, normal heart sounds and intact distal pulses. Exam reveals no gallop.  No murmur heard. Respiratory: Effort normal and breath sounds normal. No respiratory distress. He has no wheezes. He has no rales.  Musculoskeletal: He exhibits no edema.  Lymphadenopathy:    He has no cervical adenopathy.  Neurological:  Normal sensation in feet  Skin: No rash noted.  No foot lesions           Assessment & Plan:

## 2018-01-23 NOTE — Patient Instructions (Signed)
Start the metformin with 1 tab daily with breakfast. If you are not having stomach trouble, increase to 2 daily at breakfast. If you can't tolerate it, make sure to let me know.

## 2018-01-24 LAB — LIPID PANEL
CHOL/HDL RATIO: 3
Cholesterol: 155 mg/dL (ref 0–200)
HDL: 44.9 mg/dL (ref >39.00)
LDL Cholesterol: 87 mg/dL (ref 0–99)
NonHDL: 110.45
Triglycerides: 118 mg/dL (ref 0.0–149.0)
VLDL: 23.6 mg/dL (ref 0.0–40.0)

## 2018-01-24 LAB — COMPREHENSIVE METABOLIC PANEL
ALT: 23 U/L (ref 0–53)
AST: 21 U/L (ref 0–37)
Albumin: 4.2 g/dL (ref 3.5–5.2)
Alkaline Phosphatase: 110 U/L (ref 39–117)
BILIRUBIN TOTAL: 0.4 mg/dL (ref 0.2–1.2)
BUN: 24 mg/dL — ABNORMAL HIGH (ref 6–23)
CO2: 28 meq/L (ref 19–32)
CREATININE: 1.42 mg/dL (ref 0.40–1.50)
Calcium: 9.7 mg/dL (ref 8.4–10.5)
Chloride: 105 mEq/L (ref 96–112)
GFR: 58.08 mL/min — ABNORMAL LOW (ref >60.00)
GLUCOSE: 143 mg/dL — AB (ref 70–99)
Potassium: 4.1 mEq/L (ref 3.5–5.1)
Sodium: 143 mEq/L (ref 135–145)
Total Protein: 7.5 g/dL (ref 6.0–8.3)

## 2018-01-24 LAB — CBC
HCT: 40.6 % (ref 39.0–52.0)
HEMOGLOBIN: 13.6 g/dL (ref 13.0–17.0)
MCHC: 33.6 g/dL (ref 30.0–36.0)
MCV: 90.2 fl (ref 78.0–100.0)
Platelets: 282 10*3/uL (ref 150.0–400.0)
RBC: 4.5 Mil/uL (ref 4.22–5.81)
RDW: 13.7 % (ref 11.5–15.5)
WBC: 13.7 10*3/uL — AB (ref 4.0–10.5)

## 2018-01-24 LAB — T4, FREE: Free T4: 0.83 ng/dL (ref 0.60–1.60)

## 2018-01-28 ENCOUNTER — Encounter: Payer: Self-pay | Admitting: Internal Medicine

## 2018-01-30 DIAGNOSIS — H40053 Ocular hypertension, bilateral: Secondary | ICD-10-CM | POA: Diagnosis not present

## 2018-01-30 DIAGNOSIS — H40013 Open angle with borderline findings, low risk, bilateral: Secondary | ICD-10-CM | POA: Diagnosis not present

## 2018-02-01 ENCOUNTER — Other Ambulatory Visit: Payer: Self-pay | Admitting: Internal Medicine

## 2018-07-31 ENCOUNTER — Encounter: Payer: BLUE CROSS/BLUE SHIELD | Admitting: Internal Medicine

## 2018-08-04 DIAGNOSIS — E119 Type 2 diabetes mellitus without complications: Secondary | ICD-10-CM | POA: Diagnosis not present

## 2018-08-04 LAB — HM DIABETES EYE EXAM

## 2018-08-15 DIAGNOSIS — I10 Essential (primary) hypertension: Secondary | ICD-10-CM | POA: Diagnosis not present

## 2018-08-15 DIAGNOSIS — F4322 Adjustment disorder with anxiety: Secondary | ICD-10-CM | POA: Diagnosis not present

## 2018-08-15 DIAGNOSIS — Z1331 Encounter for screening for depression: Secondary | ICD-10-CM | POA: Diagnosis not present

## 2018-08-18 DIAGNOSIS — J329 Chronic sinusitis, unspecified: Secondary | ICD-10-CM | POA: Diagnosis not present

## 2018-08-18 DIAGNOSIS — Z6841 Body Mass Index (BMI) 40.0 and over, adult: Secondary | ICD-10-CM | POA: Diagnosis not present

## 2018-08-22 DIAGNOSIS — Z13220 Encounter for screening for lipoid disorders: Secondary | ICD-10-CM | POA: Diagnosis not present

## 2018-08-22 DIAGNOSIS — Z0000 Encounter for general adult medical examination without abnormal findings: Secondary | ICD-10-CM | POA: Diagnosis not present

## 2018-08-27 DIAGNOSIS — R7309 Other abnormal glucose: Secondary | ICD-10-CM | POA: Diagnosis not present

## 2018-08-28 DIAGNOSIS — I10 Essential (primary) hypertension: Secondary | ICD-10-CM | POA: Diagnosis not present

## 2018-08-28 DIAGNOSIS — M546 Pain in thoracic spine: Secondary | ICD-10-CM | POA: Diagnosis not present

## 2018-08-28 DIAGNOSIS — R079 Chest pain, unspecified: Secondary | ICD-10-CM | POA: Diagnosis not present

## 2018-08-28 DIAGNOSIS — R072 Precordial pain: Secondary | ICD-10-CM | POA: Diagnosis not present

## 2018-08-28 DIAGNOSIS — E1065 Type 1 diabetes mellitus with hyperglycemia: Secondary | ICD-10-CM | POA: Diagnosis not present

## 2018-08-28 DIAGNOSIS — B028 Zoster with other complications: Secondary | ICD-10-CM | POA: Diagnosis not present

## 2018-09-12 DIAGNOSIS — F4322 Adjustment disorder with anxiety: Secondary | ICD-10-CM | POA: Diagnosis not present

## 2018-09-12 DIAGNOSIS — B0229 Other postherpetic nervous system involvement: Secondary | ICD-10-CM | POA: Diagnosis not present

## 2018-09-12 DIAGNOSIS — R0789 Other chest pain: Secondary | ICD-10-CM | POA: Diagnosis not present

## 2018-09-19 ENCOUNTER — Telehealth: Payer: Self-pay

## 2018-09-19 NOTE — Telephone Encounter (Signed)
Called patient to schedule for new patient visit per Dr.RRR

## 2018-09-23 ENCOUNTER — Other Ambulatory Visit: Payer: Self-pay

## 2018-09-23 ENCOUNTER — Telehealth: Payer: Self-pay

## 2018-09-23 ENCOUNTER — Telehealth: Payer: BLUE CROSS/BLUE SHIELD | Admitting: Cardiology

## 2018-09-23 NOTE — Telephone Encounter (Signed)
Patient scheduled for 1 pm webex called patient to review vitals and start visit. No answer, RN left message

## 2018-09-30 NOTE — Telephone Encounter (Signed)
Patient will call back to rescheduled missed appt.

## 2018-10-01 ENCOUNTER — Other Ambulatory Visit: Payer: Self-pay | Admitting: Internal Medicine

## 2018-10-13 DIAGNOSIS — I10 Essential (primary) hypertension: Secondary | ICD-10-CM | POA: Diagnosis not present

## 2018-10-13 DIAGNOSIS — E669 Obesity, unspecified: Secondary | ICD-10-CM | POA: Diagnosis not present

## 2018-10-13 DIAGNOSIS — E1165 Type 2 diabetes mellitus with hyperglycemia: Secondary | ICD-10-CM | POA: Diagnosis not present

## 2018-11-12 DIAGNOSIS — I10 Essential (primary) hypertension: Secondary | ICD-10-CM | POA: Diagnosis not present

## 2018-11-12 DIAGNOSIS — R600 Localized edema: Secondary | ICD-10-CM | POA: Diagnosis not present

## 2018-11-12 DIAGNOSIS — E1165 Type 2 diabetes mellitus with hyperglycemia: Secondary | ICD-10-CM | POA: Diagnosis not present

## 2018-11-12 DIAGNOSIS — F4322 Adjustment disorder with anxiety: Secondary | ICD-10-CM | POA: Diagnosis not present

## 2019-01-28 DIAGNOSIS — Z20828 Contact with and (suspected) exposure to other viral communicable diseases: Secondary | ICD-10-CM | POA: Diagnosis not present

## 2019-02-14 ENCOUNTER — Other Ambulatory Visit: Payer: Self-pay | Admitting: Internal Medicine

## 2019-03-16 DIAGNOSIS — G43109 Migraine with aura, not intractable, without status migrainosus: Secondary | ICD-10-CM | POA: Diagnosis not present

## 2019-04-15 ENCOUNTER — Other Ambulatory Visit: Payer: Self-pay | Admitting: Internal Medicine

## 2019-05-11 DIAGNOSIS — R05 Cough: Secondary | ICD-10-CM | POA: Diagnosis not present

## 2019-05-11 DIAGNOSIS — Z20828 Contact with and (suspected) exposure to other viral communicable diseases: Secondary | ICD-10-CM | POA: Diagnosis not present

## 2019-05-17 ENCOUNTER — Emergency Department (HOSPITAL_COMMUNITY)
Admission: EM | Admit: 2019-05-17 | Discharge: 2019-05-17 | Disposition: A | Discharge status: Home / Self Care | Payer: BC Managed Care – PPO | Attending: Emergency Medicine | Admitting: Emergency Medicine

## 2019-05-17 ENCOUNTER — Emergency Department (HOSPITAL_COMMUNITY): Payer: BC Managed Care – PPO

## 2019-05-17 ENCOUNTER — Encounter (HOSPITAL_COMMUNITY): Payer: Self-pay | Admitting: Emergency Medicine

## 2019-05-17 ENCOUNTER — Other Ambulatory Visit: Payer: Self-pay

## 2019-05-17 DIAGNOSIS — I10 Essential (primary) hypertension: Secondary | ICD-10-CM | POA: Insufficient documentation

## 2019-05-17 DIAGNOSIS — U071 COVID-19: Secondary | ICD-10-CM

## 2019-05-17 DIAGNOSIS — Z7984 Long term (current) use of oral hypoglycemic drugs: Secondary | ICD-10-CM | POA: Insufficient documentation

## 2019-05-17 DIAGNOSIS — R079 Chest pain, unspecified: Secondary | ICD-10-CM | POA: Diagnosis not present

## 2019-05-17 DIAGNOSIS — J189 Pneumonia, unspecified organism: Secondary | ICD-10-CM | POA: Diagnosis not present

## 2019-05-17 DIAGNOSIS — Z20828 Contact with and (suspected) exposure to other viral communicable diseases: Secondary | ICD-10-CM | POA: Insufficient documentation

## 2019-05-17 DIAGNOSIS — R0789 Other chest pain: Secondary | ICD-10-CM | POA: Diagnosis not present

## 2019-05-17 DIAGNOSIS — E119 Type 2 diabetes mellitus without complications: Secondary | ICD-10-CM | POA: Insufficient documentation

## 2019-05-17 DIAGNOSIS — Z79899 Other long term (current) drug therapy: Secondary | ICD-10-CM | POA: Insufficient documentation

## 2019-05-17 LAB — CBC
HCT: 47.6 % (ref 39.0–52.0)
Hemoglobin: 15.8 g/dL (ref 13.0–17.0)
MCH: 29.5 pg (ref 26.0–34.0)
MCHC: 33.2 g/dL (ref 30.0–36.0)
MCV: 88.8 fL (ref 80.0–100.0)
Platelets: 188 10*3/uL (ref 150–400)
RBC: 5.36 MIL/uL (ref 4.22–5.81)
RDW: 12.4 % (ref 11.5–15.5)
WBC: 4.2 10*3/uL (ref 4.0–10.5)
nRBC: 0 % (ref 0.0–0.2)

## 2019-05-17 LAB — BASIC METABOLIC PANEL
Anion gap: 10 (ref 5–15)
BUN: 14 mg/dL (ref 6–20)
CO2: 25 mmol/L (ref 22–32)
Calcium: 8.9 mg/dL (ref 8.9–10.3)
Chloride: 100 mmol/L (ref 98–111)
Creatinine, Ser: 1.15 mg/dL (ref 0.61–1.24)
GFR calc Af Amer: >60 mL/min (ref >60)
GFR calc non Af Amer: >60 mL/min (ref >60)
Glucose, Bld: 204 mg/dL — ABNORMAL HIGH (ref 70–99)
Potassium: 3.4 mmol/L — ABNORMAL LOW (ref 3.5–5.1)
Sodium: 135 mmol/L (ref 135–145)

## 2019-05-17 LAB — I-STAT BETA HCG BLOOD, ED (MC, WL, AP ONLY): I-stat hCG, quantitative: <5 m[IU]/mL (ref <5)

## 2019-05-17 LAB — D-DIMER, QUANTITATIVE: D-Dimer, Quant: 0.47 ug/mL-FEU (ref 0.00–0.50)

## 2019-05-17 LAB — TROPONIN I (HIGH SENSITIVITY)
Troponin I (High Sensitivity): 21 ng/L — ABNORMAL HIGH (ref <18)
Troponin I (High Sensitivity): 20 ng/L — ABNORMAL HIGH (ref <18)

## 2019-05-17 MED ORDER — SODIUM CHLORIDE 0.9% FLUSH: 3.0000 mL | Freq: Once | INTRAVENOUS | Status: DC

## 2019-05-17 MED ORDER — IOHEXOL 350 MG/ML SOLN
100.0000 mL | Freq: Once | INTRAVENOUS | Status: AC | PRN
  Administered 2019-05-17: 68 mL via INTRAVENOUS

## 2019-05-17 MED ORDER — ALBUTEROL SULFATE HFA 108 (90 BASE) MCG/ACT IN AERS
6.0000 | INHALATION_SPRAY | Freq: Once | RESPIRATORY_TRACT | Status: AC
  Administered 2019-05-17: 16:00:00 6 via RESPIRATORY_TRACT
  Filled 2019-05-17: qty 6.7

## 2019-05-17 NOTE — ED Triage Notes (Signed)
Pt reports he is here for chest pain that started several days ago to the mid chest. Pt is positive for covid with shortness of breath, cough, headache and congestion.

## 2019-05-17 NOTE — ED Notes (Signed)
Patient verbalizes understanding of discharge instructions. Opportunity for questioning and answers were provided. Armband removed by staff, pt discharged from ED.  

## 2019-05-17 NOTE — ED Notes (Signed)
Wrong I STAT test ran. Edit sheet done.

## 2019-05-17 NOTE — Discharge Instructions (Addendum)
Results in the ER fortunately showed that he did not have any blood clot in the lungs.  There is also no internal bleeding that we appreciate. Symptoms are likely because of inflammation from COVID-19. Although it is painful to take a deep breath, we recommend that every few minutes you do make an effort to get a deep breath in you, so that you do not end up with a collapsed lung that can become infected.  Make sure you are also giving herself frequent inhaler treatment and hydrating well.  Return to the ER if your breathing gets worse.

## 2019-05-19 NOTE — ED Provider Notes (Signed)
[place] EMERGENCY DEPARTMENT Provider Note   CSN: [id number] Arrival date & time: 05/17/19  1328     History   Chief Complaint Chief Complaint  Patient presents with  . Cov+/ CP    HPI Troy Rogers is a 43 y.o. male.     HPI 43 year old male comes into the ER with chief complaint of chest pain.  Patient reports that he is having midsternal chest pain that has been present for several days.  He is also having persistent cough and shortness of breath.  He had cough with hemoptysis, and was advised to come to the ER for evaluation of PE.  Chest pain is not positional.  Overall patient reports that his symptoms are not getting better or worse at this time.   Past Medical History:  Diagnosis Date  . Cellulitis   . Gout   . Headache    hx migraines   . Hypertension   . Irritable bowel syndrome (IBS)   . Nephrolithiasis   . Newly diagnosed diabetes ([place])   . Newly diagnosed diabetes ([place])   . Obstructive sleep apnea    cpap     Patient Active Problem List   Diagnosis Date Noted  . Anxiety 02/12/2017  . Ankle contracture, left 07/02/2016  . Plantar fasciitis of left foot 07/02/2016  . Controlled type 2 diabetes mellitus without complication, without long-term current use of insulin ([place])   . Spondylolisthesis at L4-L5 level 07/19/2015  . Lumbar radiculopathy 04/27/2015  . Nephrolithiasis   . Obstructive sleep apnea   . Gout   . Essential hypertension 10/13/2014  . Morbid obesity ([place]) 10/13/2014  . Irritable bowel syndrome (IBS)     Past Surgical History:  Procedure Laterality Date  . STRABISMUS SURGERY  1983        Home Medications    Prior to Admission medications   Medication Sig Start Date End Date Taking? Authorizing Provider  allopurinol (ZYLOPRIM) 300 MG tablet TAKE 1 TABLET(300 MG) BY MOUTH DAILY 02/03/18   Viviana Simpler I, MD  carvedilol (COREG) 25 MG tablet TAKE 1 TABLET(25 MG) BY MOUTH TWICE DAILY WITH A MEAL 10/15/16    Viviana Simpler I, MD  colchicine 0.6 MG tablet Take 0.6-1.2 mg by mouth See admin instructions. 1.2 mg at onset and then 0.6 mg twice daily    [provider]  DILT-XR 120 MG 24 hr capsule TAKE 1 CAPSULE(120 MG) BY MOUTH DAILY 02/03/18   Venia Carbon, MD  furosemide (LASIX) 40 MG tablet Take 1 tablet (40 mg total) by mouth daily as needed. 04/27/15   Venia Carbon, MD  hydrOXYzine (ATARAX/VISTARIL) 10 MG tablet TAKE 1 TABLET(10 MG) BY MOUTH THREE TIMES DAILY AS NEEDED 01/23/18   Viviana Simpler I, MD  lisinopril (PRINIVIL,ZESTRIL) 40 MG tablet TAKE 1 TABLET(40 MG) BY MOUTH DAILY 02/03/18   Venia Carbon, MD  metFORMIN (GLUCOPHAGE-XR) 500 MG 24 hr tablet Take 2 tablets (1,000 mg total) by mouth daily with breakfast. 01/23/18   Venia Carbon, MD  cloNIDine (CATAPRES - DOSED IN MG/24 HR) 0.2 mg/24hr patch Place 1 patch (0.2 mg total) onto the skin once a week. Patient not taking: Reported on 10/08/2014 01/22/14 10/10/14  Samella Parr, NP    Family History Family History  Problem Relation Age of Onset  . Heart disease Mother   . COPD Mother   . Stroke Mother        TIA vs very mild  . COPD  Father   . Heart attack Maternal Grandfather   . Heart attack Paternal Grandfather   . Cancer Maternal Grandmother        in throat  . Heart attack Maternal Grandmother   . Heart disease Paternal Grandmother     Social History Social History   Tobacco Use  . Smoking status: Never Smoker  . Smokeless tobacco: Never Used  Substance Use Topics  . Alcohol use: Yes    Comment: ocassionally   . Drug use: No     Allergies   Penicillins   Review of Systems Review of Systems  Constitutional: Positive for activity change.  Respiratory: Positive for chest tightness and shortness of breath.   Cardiovascular: Positive for chest pain.  Gastrointestinal: Positive for nausea. Negative for vomiting.  Allergic/Immunologic: Negative for immunocompromised state.  All other systems  reviewed and are negative.    Physical Exam Updated Vital Signs BP (!) 159/103   Pulse 78   Temp 99.3 F (37.4 C) (Axillary)   Resp 16   SpO2 94%   Physical Exam Vitals signs and nursing note reviewed.  Constitutional:      Appearance: He is well-developed.  HENT:     Head: Atraumatic.  Neck:     Musculoskeletal: Neck supple.  Cardiovascular:     Rate and Rhythm: Normal rate.  Pulmonary:     Effort: Pulmonary effort is normal. No respiratory distress.  Skin:    General: Skin is warm.  Neurological:     Mental Status: He is alert and oriented to person, place, and time.      ED Treatments / Results  Labs (all labs ordered are listed, but only abnormal results are displayed) Labs Reviewed  BASIC METABOLIC PANEL - Abnormal; Notable for the following components:      Result Value   Potassium 3.4 (*)    Glucose, Bld 204 (*)    All other components within normal limits  TROPONIN I (HIGH SENSITIVITY) - Abnormal; Notable for the following components:   Troponin I (High Sensitivity) 21 (*)    All other components within normal limits  TROPONIN I (HIGH SENSITIVITY) - Abnormal; Notable for the following components:   Troponin I (High Sensitivity) 20 (*)    All other components within normal limits  CBC  D-DIMER, QUANTITATIVE (NOT AT [facility])  I-STAT BETA HCG BLOOD, ED ([facility], [facility], [facility] ONLY)    EKG EKG Interpretation  Date/Time:  Sunday May 17 2019 14:01:48 EST Ventricular Rate:  88 PR Interval:  184 QRS Duration: 66 QT Interval:  346 QTC Calculation: 418 R Axis:   56 Text Interpretation: Normal sinus rhythm Cannot rule out Anterior infarct , age undetermined Abnormal ECG No acute changes No significant change since last tracing Confirmed by Derwood Kaplan [phone]) on 05/17/2019 3:03:08 PM   Radiology No results found.  Procedures Procedures (including critical care time)  Troy Rogers was evaluated in Emergency Department on 05/19/2019 for the symptoms  described in the history of present illness. He was evaluated in the context of the global COVID-19 pandemic, which necessitated consideration that the patient might be at risk for infection with the SARS-CoV-2 virus that causes COVID-19. Institutional protocols and algorithms that pertain to the evaluation of patients at risk for COVID-19 are in a state of rapid change based on information released by regulatory bodies including the CDC and federal and state organizations. These policies and algorithms were followed during the patient's care in the ED.   Medications Ordered in  ED Medications  albuterol (VENTOLIN HFA) 108 (90 Base) MCG/ACT inhaler 6 puff (6 puffs Inhalation Given 05/17/19 1629)  iohexol (OMNIPAQUE) 350 MG/ML injection 100 mL (68 mLs Intravenous Contrast Given 05/17/19 1707)     Initial Impression / Assessment and Plan / ED Course  I have reviewed the triage vital signs and the nursing notes.  Pertinent labs & imaging results that were available during my care of the patient were reviewed by me and considered in my medical decision making (see chart for details).        43 year old comes in a chief complaint of chest pain. He is confirmed Covid positive patient and is having hemoptysis with his chest pain.  CT angio ordered it does not show any acute PE.  He is not hypoxic and overall hemodynamically stable.  The patient appears reasonably screened and/or stabilized for discharge and I doubt any other medical condition or other [facility] requiring further screening, evaluation, or treatment in the ED at this time prior to discharge.   Results from the ER workup discussed with the patient face to face and all questions answered to the best of my ability. The patient is safe for discharge with strict return precautions.   Si RaiderJohn G Shawley was evaluated in Emergency Department on 05/19/2019 for the symptoms described in the history of present illness. He was evaluated in the context  of the global COVID-19 pandemic, which necessitated consideration that the patient might be at risk for infection with the SARS-CoV-2 virus that causes COVID-19. Institutional protocols and algorithms that pertain to the evaluation of patients at risk for COVID-19 are in a state of rapid change based on information released by regulatory bodies including the CDC and federal and state organizations. These policies and algorithms were followed during the patient's care in the ED.   Final Clinical Impressions(s) / ED Diagnoses   Final diagnoses:  COVID-19  Pneumonitis    ED Discharge Orders    None       Derwood KaplanNanavati, Rocquel Askren, MD 05/19/19 2313

## 2019-05-25 DIAGNOSIS — J1289 Other viral pneumonia: Secondary | ICD-10-CM | POA: Diagnosis not present

## 2019-05-25 DIAGNOSIS — R05 Cough: Secondary | ICD-10-CM | POA: Diagnosis not present

## 2019-05-25 DIAGNOSIS — U071 COVID-19: Secondary | ICD-10-CM | POA: Diagnosis not present

## 2019-05-27 DIAGNOSIS — R05 Cough: Secondary | ICD-10-CM | POA: Diagnosis not present

## 2019-05-27 DIAGNOSIS — U071 COVID-19: Secondary | ICD-10-CM | POA: Diagnosis not present

## 2019-05-27 DIAGNOSIS — Z6841 Body Mass Index (BMI) 40.0 and over, adult: Secondary | ICD-10-CM | POA: Diagnosis not present

## 2019-06-01 DIAGNOSIS — J1289 Other viral pneumonia: Secondary | ICD-10-CM | POA: Diagnosis not present

## 2019-06-01 DIAGNOSIS — R05 Cough: Secondary | ICD-10-CM | POA: Diagnosis not present

## 2019-06-03 DIAGNOSIS — Z6841 Body Mass Index (BMI) 40.0 and over, adult: Secondary | ICD-10-CM | POA: Diagnosis not present

## 2019-06-03 DIAGNOSIS — U071 COVID-19: Secondary | ICD-10-CM | POA: Diagnosis not present

## 2019-06-03 DIAGNOSIS — J1289 Other viral pneumonia: Secondary | ICD-10-CM | POA: Diagnosis not present

## 2019-06-17 DIAGNOSIS — Z6841 Body Mass Index (BMI) 40.0 and over, adult: Secondary | ICD-10-CM | POA: Diagnosis not present

## 2019-06-17 DIAGNOSIS — U071 COVID-19: Secondary | ICD-10-CM | POA: Diagnosis not present

## 2019-06-17 DIAGNOSIS — J1289 Other viral pneumonia: Secondary | ICD-10-CM | POA: Diagnosis not present

## 2019-07-03 DIAGNOSIS — E119 Type 2 diabetes mellitus without complications: Secondary | ICD-10-CM | POA: Diagnosis not present

## 2019-07-17 DIAGNOSIS — R600 Localized edema: Secondary | ICD-10-CM | POA: Diagnosis not present

## 2019-07-17 DIAGNOSIS — M5387 Other specified dorsopathies, lumbosacral region: Secondary | ICD-10-CM | POA: Diagnosis not present

## 2019-07-17 DIAGNOSIS — M25551 Pain in right hip: Secondary | ICD-10-CM | POA: Diagnosis not present

## 2019-07-17 DIAGNOSIS — M7989 Other specified soft tissue disorders: Secondary | ICD-10-CM | POA: Diagnosis not present

## 2019-07-23 DIAGNOSIS — M25551 Pain in right hip: Secondary | ICD-10-CM | POA: Diagnosis not present

## 2019-08-04 DIAGNOSIS — M25551 Pain in right hip: Secondary | ICD-10-CM | POA: Diagnosis not present

## 2019-08-10 DIAGNOSIS — M25551 Pain in right hip: Secondary | ICD-10-CM | POA: Diagnosis not present

## 2019-08-26 DIAGNOSIS — M25551 Pain in right hip: Secondary | ICD-10-CM | POA: Diagnosis not present

## 2019-09-17 DIAGNOSIS — Z0000 Encounter for general adult medical examination without abnormal findings: Secondary | ICD-10-CM | POA: Diagnosis not present

## 2019-09-17 DIAGNOSIS — I119 Hypertensive heart disease without heart failure: Secondary | ICD-10-CM | POA: Diagnosis not present

## 2019-09-17 DIAGNOSIS — Z1331 Encounter for screening for depression: Secondary | ICD-10-CM | POA: Diagnosis not present

## 2019-09-24 DIAGNOSIS — R109 Unspecified abdominal pain: Secondary | ICD-10-CM | POA: Diagnosis not present

## 2019-10-26 DIAGNOSIS — Z13220 Encounter for screening for lipoid disorders: Secondary | ICD-10-CM | POA: Diagnosis not present

## 2019-10-26 DIAGNOSIS — G43109 Migraine with aura, not intractable, without status migrainosus: Secondary | ICD-10-CM | POA: Diagnosis not present

## 2019-10-26 DIAGNOSIS — F4322 Adjustment disorder with anxiety: Secondary | ICD-10-CM | POA: Diagnosis not present

## 2019-10-26 DIAGNOSIS — Z131 Encounter for screening for diabetes mellitus: Secondary | ICD-10-CM | POA: Diagnosis not present

## 2019-10-26 DIAGNOSIS — Z6841 Body Mass Index (BMI) 40.0 and over, adult: Secondary | ICD-10-CM | POA: Diagnosis not present

## 2019-10-26 DIAGNOSIS — Z0000 Encounter for general adult medical examination without abnormal findings: Secondary | ICD-10-CM | POA: Diagnosis not present

## 2019-11-25 DIAGNOSIS — Z6841 Body Mass Index (BMI) 40.0 and over, adult: Secondary | ICD-10-CM | POA: Diagnosis not present

## 2019-11-25 DIAGNOSIS — G5622 Lesion of ulnar nerve, left upper limb: Secondary | ICD-10-CM | POA: Diagnosis not present

## 2019-11-25 DIAGNOSIS — M25622 Stiffness of left elbow, not elsewhere classified: Secondary | ICD-10-CM | POA: Diagnosis not present

## 2019-12-03 DIAGNOSIS — M25522 Pain in left elbow: Secondary | ICD-10-CM | POA: Diagnosis not present

## 2019-12-03 DIAGNOSIS — G5622 Lesion of ulnar nerve, left upper limb: Secondary | ICD-10-CM | POA: Diagnosis not present

## 2019-12-03 DIAGNOSIS — M7712 Lateral epicondylitis, left elbow: Secondary | ICD-10-CM | POA: Diagnosis not present

## 2019-12-07 DIAGNOSIS — G5622 Lesion of ulnar nerve, left upper limb: Secondary | ICD-10-CM | POA: Diagnosis not present

## 2019-12-07 DIAGNOSIS — G5602 Carpal tunnel syndrome, left upper limb: Secondary | ICD-10-CM | POA: Diagnosis not present

## 2019-12-17 ENCOUNTER — Encounter (HOSPITAL_COMMUNITY): Payer: Self-pay | Admitting: Orthopedic Surgery

## 2019-12-17 ENCOUNTER — Other Ambulatory Visit: Payer: Self-pay

## 2019-12-17 NOTE — Anesthesia Preprocedure Evaluation (Addendum)
Anesthesia Evaluation  Patient identified by MRN, date of birth, ID band Patient awake    Reviewed: Allergy & Precautions, NPO status , Patient's Chart, lab work & pertinent test results  History of Anesthesia Complications Negative for: history of anesthetic complications  Airway Mallampati: II  TM Distance: >3 FB Neck ROM: Full    Dental no notable dental hx.    Pulmonary asthma , sleep apnea and Continuous Positive Airway Pressure Ventilation , Patient abstained from smoking.,    Pulmonary exam normal        Cardiovascular hypertension, Pt. on medications and Pt. 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  Reproductive/Obstetrics negative OB ROS                            Anesthesia Physical Anesthesia Plan  ASA: III  Anesthesia Plan: General   Post-op Pain Management:    Induction: Intravenous  PONV Risk Score and Plan: 2 and Treatment may vary due to age or medical condition, Ondansetron, Midazolam and Dexamethasone  Airway Management Planned: Oral ETT  Additional Equipment: None  Intra-op Plan:   Post-operative Plan: Extubation in OR  Informed Consent: I have reviewed the patients History and Physical, chart, labs and discussed the procedure including the risks, benefits and alternatives for the proposed anesthesia with the patient or  authorized representative who has indicated his/her understanding and acceptance.     Dental advisory given  Plan Discussed with: CRNA  Anesthesia Plan Comments: (PAT note written 12/17/2019 by Shonna Chock, PA-C. SAME DAY WORK-UP. History morbid obesity, OSA, DM2, HTN, COVID-19 PNA 04/2019.   )      Anesthesia Quick Evaluation

## 2019-12-17 NOTE — Progress Notes (Signed)
[name] denies cheat pain or shortness of breath. [name] reports that he was told that he can be tested for Covid on arrival.  [name] was seen in ED 04/2020 with chest pain and shortness of breath, it was determined to be related to positive Covid trest.  Patient was seen In ED 08/2018 with chest pain, it was determined to be related to Shingles.  [name] has type II diabetes, he reports that last Hemo A1C was 8 something.  I instructed patient to not take Metformin in am.  Mr Troy Rogers does not check CBGs, but he does have a machine. I instructed patient to check CBG after awaking and every 2 hours until arrival  to the hospital.  I Instructed patient if CBG is less than 70 to drin1 1/2 cup of a clear juice. Recheck CBG in 15 minutes then call pre- op desk at [phone] for further instructions.   PCP is Buckner Malta at [facility], Mrs. Tuzzolino works there, I spoke to Mrs Portman who said she has permission to request records, I then spoke to [facility] and told her what we are looking for- Last office notes, EKG, Labs, any cardiac reports, sleep study - the office is not able to receive fax, but can send them. Morrie Sheldon states that she does not have an EKG with in the past year and no cardiac studies or sleep study.

## 2019-12-17 NOTE — Progress Notes (Signed)
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  History includes never smoker, HTN, IBS, asthma, DM2, GERD, anxiety, gout, nephrolithiasis, migraines, COVID-19 (with pneumonia 04/2019). BMI is consistent with morbid obesity. - ED visit 05/17/19 for cough with hemoptysis, dyspnea, chest pain in the setting of known + COVID-19. He was advised to go to the ED for evaluation of PE. No hypoxia. EKG NSR. High sensitivity Troponin I 21->20. D-dimer 0.47. CTA was negative for PE and pericardial effusion but areas of consolidation approximately 25% of lung without pleural effusion or PTX. Findings felt consistent with COVID-19 pneumonia. He was prescribed albuterol inhaler and discharged home.   Last evaluation 11/25/2019 at [place].  He was seen by Maudry Diego, MD with referral to Dr. Amedeo Plenty for left ulnar neuropathy.  He denied chest pain and SOB per PAT RN phone interview. He is for a presurgical COVID-19 test on the day of surgery. He is for labs and anesthesia team evaluation on the day of surgery. He reported his last A1c was in the "8" range.    VS: At 11/25/19 primary care visit: O2 sat 97% (RA), BP 126/88, HR 87 (regular), RR 16, HT 67.5 in, WT 356 lb, BMI 54.93.    PROVIDERS: Serita Grammes, MD is PCP ([place])  - He is not routinely followed by a cardiologist, but he was seen by [facility] (Dr. Mare Ferrari, Dr. Angelena Form) during a 12/2013 admission for atypical chest pain with  hemoptysis in which he ruled out for MI. CTA was negative for PE, cannot rule out pneumonitis. Echo was done showed normal LVEF and no further cardiac work-up was recommended. He was treated for community acquired pneumonia.   - Previously seen by pulmonologist Baird Lyons, MD of [facility].   LABS: For day of surgery.   NPSG 02/18/15: Severe OSA, AHI 66.4/ hr, desat to 79%, CPAP titrated to 10. Wt 384 lbs.   IMAGES: CTA Chest 05/17/19: IMPRESSION: 1. Negative for acute pulmonary embolism. 2. Patchy bilateral peripheral predominant ground-glass opacities with areas of consolidation involve approximately 25% of the lung. This is consistent with COVID-19 pneumonia. 3. Mild cardiomegaly. 4. Hepatic steatosis.  1V PCXR 05/17/19: FINDINGS: Apical lordotic positioning. Midline trachea. Borderline cardiomegaly. No pleural effusion or pneumothorax. Clear lungs. IMPRESSION: No acute cardiopulmonary disease.   EKG: 05/17/19: Normal sinus rhythm Cannot rule out Anterior infarct , age undetermined Abnormal ECG No acute changes No significant change since last tracing Confirmed by Varney Biles [phone]) on 05/17/2019 3:03:08 PM   CV: According to [facility] Physician notes, "RLE venous US at [facility] on 07/17/19 showed: "no evidence of deep venous thrombosis in the right lower extremity. Left common femoral vein also patent."  Echo 01/21/14:  Study Conclusions - Left ventricle: The cavity size was normal. There was mild concentric hypertrophy. Systolic function was normal. The estimated ejection fraction was in the range of 55% to 60%. Although no diagnostic regional wall motion abnormality was identified, this possibility cannot be completely excluded on  the basis of this study. Doppler parameters are consistent with abnormal left ventricular relaxation (grade 1 diastolic dysfunction). - Left atrium: The atrium was moderately dilated.   Past Medical  History:  Diagnosis Date  . Anxiety   . Asthma   . Carpal tunnel syndrome on left   . Cellulitis   . Diabetes mellitus, type 2 (HCC)   . GERD (gastroesophageal reflux disease)    ocassional  . Gout   . Headache    hx migraines   . History of kidney stones    x 2  . Hypertension   . Irritable bowel syndrome (IBS)   . Obstructive sleep apnea    cpap   . Pneumonia     Past Surgical History:  Procedure Laterality Date  . Lumber fusion  06/2015   Lumbar 4  . STRABISMUS SURGERY Right 1983    MEDICATIONS: No current facility-administered medications for this encounter.   Marland Kitchen allopurinol (ZYLOPRIM) 300 MG tablet  . carvedilol (COREG) 25 MG tablet  . escitalopram (LEXAPRO) 20 MG tablet  . gabapentin (NEURONTIN) 100 MG capsule  . hydrochlorothiazide (HYDRODIURIL) 25 MG tablet  . lisinopril (PRINIVIL,ZESTRIL) 40 MG tablet  . metFORMIN (GLUCOPHAGE-XR) 500 MG 24 hr tablet  . SUMAtriptan (IMITREX) 50 MG tablet  . furosemide (LASIX) 40 MG tablet    Shonna Chock, PA-C Surgical Short Stay/Anesthesiology [facility] Phone [phone] [facility] Phone [phone] 12/17/2019 2:14 PM

## 2019-12-18 ENCOUNTER — Ambulatory Visit (HOSPITAL_COMMUNITY): Payer: BC Managed Care – PPO | Admitting: Vascular Surgery

## 2019-12-18 ENCOUNTER — Other Ambulatory Visit: Payer: Self-pay

## 2019-12-18 ENCOUNTER — Ambulatory Visit (HOSPITAL_COMMUNITY)
Admission: RE | Admit: 2019-12-18 | Discharge: 2019-12-18 | Disposition: A | Discharge status: Home / Self Care | Payer: BC Managed Care – PPO | Attending: Orthopedic Surgery | Admitting: Orthopedic Surgery

## 2019-12-18 ENCOUNTER — Encounter (HOSPITAL_COMMUNITY)
Admission: RE | Disposition: A | Discharge status: Home / Self Care | Payer: Self-pay | Source: Home / Self Care | Attending: Orthopedic Surgery

## 2019-12-18 ENCOUNTER — Encounter (HOSPITAL_COMMUNITY): Payer: Self-pay | Admitting: Orthopedic Surgery

## 2019-12-18 DIAGNOSIS — E1140 Type 2 diabetes mellitus with diabetic neuropathy, unspecified: Secondary | ICD-10-CM | POA: Diagnosis not present

## 2019-12-18 DIAGNOSIS — G5602 Carpal tunnel syndrome, left upper limb: Secondary | ICD-10-CM | POA: Diagnosis not present

## 2019-12-18 DIAGNOSIS — K219 Gastro-esophageal reflux disease without esophagitis: Secondary | ICD-10-CM | POA: Insufficient documentation

## 2019-12-18 DIAGNOSIS — J45909 Unspecified asthma, uncomplicated: Secondary | ICD-10-CM | POA: Diagnosis not present

## 2019-12-18 DIAGNOSIS — Z20822 Contact with and (suspected) exposure to covid-19: Secondary | ICD-10-CM | POA: Insufficient documentation

## 2019-12-18 DIAGNOSIS — F419 Anxiety disorder, unspecified: Secondary | ICD-10-CM | POA: Diagnosis not present

## 2019-12-18 DIAGNOSIS — I10 Essential (primary) hypertension: Secondary | ICD-10-CM | POA: Diagnosis not present

## 2019-12-18 DIAGNOSIS — G5622 Lesion of ulnar nerve, left upper limb: Secondary | ICD-10-CM | POA: Insufficient documentation

## 2019-12-18 DIAGNOSIS — M109 Gout, unspecified: Secondary | ICD-10-CM | POA: Diagnosis not present

## 2019-12-18 DIAGNOSIS — Z79899 Other long term (current) drug therapy: Secondary | ICD-10-CM | POA: Diagnosis not present

## 2019-12-18 DIAGNOSIS — G4733 Obstructive sleep apnea (adult) (pediatric): Secondary | ICD-10-CM | POA: Insufficient documentation

## 2019-12-18 DIAGNOSIS — Z7984 Long term (current) use of oral hypoglycemic drugs: Secondary | ICD-10-CM | POA: Insufficient documentation

## 2019-12-18 HISTORY — DX: Carpal tunnel syndrome, left upper limb: G56.02

## 2019-12-18 HISTORY — DX: Unspecified asthma, uncomplicated: J45.909

## 2019-12-18 HISTORY — PX: CARPAL TUNNEL WITH CUBITAL TUNNEL: SHX5608

## 2019-12-18 HISTORY — DX: Type 2 diabetes mellitus without complications: E11.9

## 2019-12-18 HISTORY — DX: Personal history of urinary calculi: Z87.442

## 2019-12-18 HISTORY — DX: Anxiety disorder, unspecified: F41.9

## 2019-12-18 HISTORY — DX: Pneumonia, unspecified organism: J18.9

## 2019-12-18 HISTORY — DX: Gastro-esophageal reflux disease without esophagitis: K21.9

## 2019-12-18 LAB — CBC
HCT: 43.5 % (ref 39.0–52.0)
Hemoglobin: 14.5 g/dL (ref 13.0–17.0)
MCH: 30.7 pg (ref 26.0–34.0)
MCHC: 33.3 g/dL (ref 30.0–36.0)
MCV: 92.2 fL (ref 80.0–100.0)
Platelets: 249 10*3/uL (ref 150–400)
RBC: 4.72 MIL/uL (ref 4.22–5.81)
RDW: 13.2 % (ref 11.5–15.5)
WBC: 7.8 10*3/uL (ref 4.0–10.5)
nRBC: 0 % (ref 0.0–0.2)

## 2019-12-18 LAB — BASIC METABOLIC PANEL
Anion gap: 11 (ref 5–15)
BUN: 15 mg/dL (ref 6–20)
CO2: 29 mmol/L (ref 22–32)
Calcium: 9.2 mg/dL (ref 8.9–10.3)
Chloride: 100 mmol/L (ref 98–111)
Creatinine, Ser: 1.11 mg/dL (ref 0.61–1.24)
GFR calc Af Amer: >60 mL/min (ref >60)
GFR calc non Af Amer: >60 mL/min (ref >60)
Glucose, Bld: 164 mg/dL — ABNORMAL HIGH (ref 70–99)
Potassium: 3.2 mmol/L — ABNORMAL LOW (ref 3.5–5.1)
Sodium: 140 mmol/L (ref 135–145)

## 2019-12-18 LAB — GLUCOSE, CAPILLARY
Glucose-Capillary: 162 mg/dL — ABNORMAL HIGH (ref 70–99)
Glucose-Capillary: 126 mg/dL — ABNORMAL HIGH (ref 70–99)
Glucose-Capillary: 135 mg/dL — ABNORMAL HIGH (ref 70–99)

## 2019-12-18 LAB — SARS CORONAVIRUS 2 BY RT PCR (HOSPITAL ORDER, PERFORMED IN ~~LOC~~ HOSPITAL LAB): SARS Coronavirus 2: NEGATIVE

## 2019-12-18 SURGERY — RELEASE, CARPAL TUNNEL AND CUBITAL TUNNEL
Anesthesia: General | Laterality: Left

## 2019-12-18 MED ORDER — LACTATED RINGERS IV SOLN: INTRAVENOUS | Status: DC | PRN

## 2019-12-18 MED ORDER — MIDAZOLAM HCL 2 MG/2ML IJ SOLN
INTRAMUSCULAR | Status: AC
  Filled 2019-12-18: qty 2

## 2019-12-18 MED ORDER — FENTANYL CITRATE (PF) 250 MCG/5ML IJ SOLN
INTRAMUSCULAR | Status: AC
  Filled 2019-12-18: qty 5

## 2019-12-18 MED ORDER — OXYCODONE HCL 5 MG PO TABS
5.0000 mg | ORAL_TABLET | Freq: Once | ORAL | Status: AC | PRN
  Administered 2019-12-18: 5 mg via ORAL

## 2019-12-18 MED ORDER — EPHEDRINE SULFATE-NACL 50-0.9 MG/10ML-% IV SOSY
PREFILLED_SYRINGE | INTRAVENOUS | Status: DC | PRN
  Administered 2019-12-18 (×5): 10 mg via INTRAVENOUS

## 2019-12-18 MED ORDER — ROCURONIUM BROMIDE 10 MG/ML (PF) SYRINGE
PREFILLED_SYRINGE | INTRAVENOUS | Status: DC | PRN
  Administered 2019-12-18: 20 mg via INTRAVENOUS
  Administered 2019-12-18: 40 mg via INTRAVENOUS

## 2019-12-18 MED ORDER — LACTATED RINGERS IV SOLN: INTRAVENOUS | Status: DC

## 2019-12-18 MED ORDER — OXYCODONE HCL 5 MG/5ML PO SOLN: 5.0000 mg | Freq: Once | ORAL | Status: AC | PRN

## 2019-12-18 MED ORDER — PROPOFOL 10 MG/ML IV BOLUS
INTRAVENOUS | Status: AC
  Filled 2019-12-18: qty 20

## 2019-12-18 MED ORDER — SUCCINYLCHOLINE CHLORIDE 200 MG/10ML IV SOSY
PREFILLED_SYRINGE | INTRAVENOUS | Status: AC
  Filled 2019-12-18: qty 10

## 2019-12-18 MED ORDER — FENTANYL CITRATE (PF) 100 MCG/2ML IJ SOLN
INTRAMUSCULAR | Status: AC
  Filled 2019-12-18: qty 2

## 2019-12-18 MED ORDER — CHLORHEXIDINE GLUCONATE 0.12 % MT SOLN
OROMUCOSAL | Status: AC
  Administered 2019-12-18: 15 mL via OROMUCOSAL
  Filled 2019-12-18: qty 15

## 2019-12-18 MED ORDER — CEFAZOLIN SODIUM 1 G IJ SOLR
INTRAMUSCULAR | Status: AC
  Filled 2019-12-18: qty 10

## 2019-12-18 MED ORDER — ROCURONIUM BROMIDE 10 MG/ML (PF) SYRINGE
PREFILLED_SYRINGE | INTRAVENOUS | Status: AC
  Filled 2019-12-18: qty 10

## 2019-12-18 MED ORDER — LABETALOL HCL 5 MG/ML IV SOLN
INTRAVENOUS | Status: DC | PRN
  Administered 2019-12-18: 10 mg via INTRAVENOUS
  Administered 2019-12-18: 5 mg via INTRAVENOUS

## 2019-12-18 MED ORDER — FENTANYL CITRATE (PF) 100 MCG/2ML IJ SOLN: 25.0000 ug | INTRAMUSCULAR | Status: DC | PRN

## 2019-12-18 MED ORDER — MIDAZOLAM HCL 5 MG/5ML IJ SOLN
INTRAMUSCULAR | Status: DC | PRN
  Administered 2019-12-18 (×2): 1 mg via INTRAVENOUS

## 2019-12-18 MED ORDER — PROMETHAZINE HCL 25 MG/ML IJ SOLN: 6.2500 mg | INTRAMUSCULAR | Status: DC | PRN

## 2019-12-18 MED ORDER — FENTANYL CITRATE (PF) 250 MCG/5ML IJ SOLN
INTRAMUSCULAR | Status: DC | PRN
  Administered 2019-12-18: 100 ug via INTRAVENOUS
  Administered 2019-12-18: 50 ug via INTRAVENOUS

## 2019-12-18 MED ORDER — ONDANSETRON HCL 4 MG/2ML IJ SOLN
INTRAMUSCULAR | Status: DC | PRN
  Administered 2019-12-18: 4 mg via INTRAVENOUS

## 2019-12-18 MED ORDER — ACETAMINOPHEN 10 MG/ML IV SOLN: 1000.0000 mg | Freq: Once | INTRAVENOUS | Status: DC | PRN

## 2019-12-18 MED ORDER — ONDANSETRON HCL 4 MG/2ML IJ SOLN
INTRAMUSCULAR | Status: AC
  Filled 2019-12-18: qty 2

## 2019-12-18 MED ORDER — OXYCODONE HCL 5 MG PO TABS
ORAL_TABLET | ORAL | Status: AC
  Filled 2019-12-18: qty 1

## 2019-12-18 MED ORDER — SUCCINYLCHOLINE CHLORIDE 200 MG/10ML IV SOSY
PREFILLED_SYRINGE | INTRAVENOUS | Status: DC | PRN
  Administered 2019-12-18: 200 mg via INTRAVENOUS

## 2019-12-18 MED ORDER — ORAL CARE MOUTH RINSE: 15.0000 mL | Freq: Once | OROMUCOSAL | Status: AC

## 2019-12-18 MED ORDER — LIDOCAINE HCL (CARDIAC) PF 100 MG/5ML IV SOSY
PREFILLED_SYRINGE | INTRAVENOUS | Status: DC | PRN
  Administered 2019-12-18: 100 mg via INTRAVENOUS

## 2019-12-18 MED ORDER — DEXTROSE 5 % IV SOLN
3.0000 g | INTRAVENOUS | Status: AC
  Administered 2019-12-18: 3 g via INTRAVENOUS
  Filled 2019-12-18: qty 3

## 2019-12-18 MED ORDER — SUGAMMADEX SODIUM 200 MG/2ML IV SOLN
INTRAVENOUS | Status: DC | PRN
  Administered 2019-12-18: 200 mg via INTRAVENOUS

## 2019-12-18 MED ORDER — LABETALOL HCL 5 MG/ML IV SOLN
INTRAVENOUS | Status: AC
  Filled 2019-12-18: qty 4

## 2019-12-18 MED ORDER — LIDOCAINE 2% (20 MG/ML) 5 ML SYRINGE
INTRAMUSCULAR | Status: AC
  Filled 2019-12-18: qty 5

## 2019-12-18 MED ORDER — CHLORHEXIDINE GLUCONATE 0.12 % MT SOLN: 15.0000 mL | Freq: Once | OROMUCOSAL | Status: AC

## 2019-12-18 MED ORDER — SODIUM CHLORIDE 0.9 % IR SOLN
Status: DC | PRN
  Administered 2019-12-18: 1000 mL

## 2019-12-18 MED ORDER — FENTANYL CITRATE (PF) 100 MCG/2ML IJ SOLN
25.0000 ug | INTRAMUSCULAR | Status: DC | PRN
  Administered 2019-12-18 (×2): 50 ug via INTRAVENOUS

## 2019-12-18 MED ORDER — PROPOFOL 10 MG/ML IV BOLUS
INTRAVENOUS | Status: DC | PRN
  Administered 2019-12-18: 200 mg via INTRAVENOUS

## 2019-12-18 SURGICAL SUPPLY — 40 items
BNDG ELASTIC 4X5.8 VLCR STR LF (GAUZE/BANDAGES/DRESSINGS) ×3 IMPLANT
BNDG ELASTIC 6X5.8 VLCR STR LF (GAUZE/BANDAGES/DRESSINGS) ×1 IMPLANT
BNDG GAUZE ELAST 4 BULKY (GAUZE/BANDAGES/DRESSINGS) ×2 IMPLANT
CORD BIPOLAR FORCEPS 12FT (ELECTRODE) ×2 IMPLANT
COVER SURGICAL LIGHT HANDLE (MISCELLANEOUS) ×2 IMPLANT
CUFF TOURN SGL QUICK 18X4 (TOURNIQUET CUFF) ×2 IMPLANT
DRAPE SURG 17X23 STRL (DRAPES) ×2 IMPLANT
GAUZE SPONGE 4X4 12PLY STRL (GAUZE/BANDAGES/DRESSINGS) ×1 IMPLANT
GAUZE XEROFORM 5X9 LF (GAUZE/BANDAGES/DRESSINGS) ×1 IMPLANT
GLOVE BIOGEL M 8.0 STRL (GLOVE) ×2 IMPLANT
GLOVE SS BIOGEL STRL SZ 8 (GLOVE) ×1 IMPLANT
GLOVE SUPERSENSE BIOGEL SZ 8 (GLOVE) ×1
GOWN STRL REUS W/ TWL LRG LVL3 (GOWN DISPOSABLE) ×2 IMPLANT
GOWN STRL REUS W/ TWL XL LVL3 (GOWN DISPOSABLE) ×3 IMPLANT
GOWN STRL REUS W/TWL LRG LVL3 (GOWN DISPOSABLE) ×4
GOWN STRL REUS W/TWL XL LVL3 (GOWN DISPOSABLE) ×6
KIT BASIN OR (CUSTOM PROCEDURE TRAY) ×2 IMPLANT
KIT TURNOVER KIT B (KITS) ×2 IMPLANT
LOOP VESSEL MAXI BLUE (MISCELLANEOUS) ×1 IMPLANT
NDL HYPO 25GX1X1/2 BEV (NEEDLE) IMPLANT
NEEDLE HYPO 25GX1X1/2 BEV (NEEDLE) ×2 IMPLANT
NS IRRIG 1000ML POUR BTL (IV SOLUTION) ×2 IMPLANT
PACK ORTHO EXTREMITY (CUSTOM PROCEDURE TRAY) ×2 IMPLANT
PAD ARMBOARD 7.5X6 YLW CONV (MISCELLANEOUS) ×4 IMPLANT
PAD CAST 4YDX4 CTTN HI CHSV (CAST SUPPLIES) ×2 IMPLANT
PADDING CAST COTTON 4X4 STRL (CAST SUPPLIES) ×6
PADDING CAST SYN 6 (CAST SUPPLIES) ×1
PADDING CAST SYNTHETIC 6X4 NS (CAST SUPPLIES) IMPLANT
SLING ARM FOAM STRAP XLG (SOFTGOODS) ×1 IMPLANT
SOL PREP POV-IOD 4OZ 10% (MISCELLANEOUS) ×6 IMPLANT
SUCTION FRAZIER HANDLE 10FR (MISCELLANEOUS) ×2
SUCTION TUBE FRAZIER 10FR DISP (MISCELLANEOUS) IMPLANT
SUT PROLENE 3 0 PS 1 (SUTURE) ×3 IMPLANT
SUT PROLENE 4 0 PS 2 18 (SUTURE) ×2 IMPLANT
SUT VIC AB 2-0 CT1 27 (SUTURE) ×2
SUT VIC AB 2-0 CT1 TAPERPNT 27 (SUTURE) IMPLANT
TOWEL GREEN STERILE (TOWEL DISPOSABLE) ×2 IMPLANT
TOWEL GREEN STERILE FF (TOWEL DISPOSABLE) ×2 IMPLANT
TUBE CONNECTING 12X1/4 (SUCTIONS) ×1 IMPLANT
UNDERPAD 30X36 HEAVY ABSORB (UNDERPADS AND DIAPERS) ×2 IMPLANT

## 2019-12-18 NOTE — Discharge Instructions (Signed)
Please elevate move and massage her fingers and keep your bandage clean and dry.  Please call for any problems.  Our office will call so we can see you in 2 weeks.  Your operation went very well.  Keep bandage clean and dry.  Call for any problems.  No smoking.  Criteria for driving a car: you should be off your pain medicine for 7-8 hours, able to drive one handed(confident), thinking clearly and feeling able in your judgement to drive. Continue elevation as it will decrease swelling.  If instructed by MD move your fingers within the confines of the bandage/splint.  Use ice if instructed by your MD. Call immediately for any sudden loss of feeling in your hand/arm or change in functional abilities of the extremity.We recommend that you to take vitamin C 1000 mg a day to promote healing. We also recommend that if you require  pain medicine that you take a stool softener to prevent constipation as most pain medicines will have constipation side effects. We recommend either Peri-Colace or Senokot and recommend that you also consider adding MiraLAX as well to prevent the constipation affects from pain medicine if you are required to use them. These medicines are over the counter and may be purchased at a local pharmacy. A cup of yogurt and a probiotic can also be helpful during the recovery process as the medicines can disrupt your intestinal environment.

## 2019-12-18 NOTE — Op Note (Signed)
Operative note 12/18/2019  Roseanne Kaufman MD.  Preoperative diagnosis: Left carpal tunnel syndrome.  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  Postop diagnosis: The same.  Operative procedure #1 left open carpal tunnel release #2 left ulnar nerve release and anterior transposition at the elbow #3 left flexor pronator lengthening/fascial release at the elbow  Surgeon Roseanne Kaufman  Anesthesia General  Estimated blood loss minimal  Drains none  Indications for the procedure this patient is a 44 year old male with significant compression neuropathy about the ulnar and median nerve left upper extremity.  He understands risks and benefits and desires to proceed with surgical intervention.  Operative procedure patient was seen by myself and anesthesia.  He was taken to the operative suite after counseling and underwent a general anesthetic.  The arm was then very carefully held and underwent a Hibiclens scrub x2 followed by 10-minute surgical Betadine scrub and paint.  I performed this myself given the large girth of the arm and heaviness.  I secured the sterile field and timeout was then observed.  The operation commenced with tourniquet insufflation of a sterile tourniquet 250 mmHg.  Incision was made 1 inch in nature at the distal edge of the transverse carpal ligament and course proximally and following this retractor was placed palmar fascia incised distal edge of the transverse carpal ligament was released under 4.0 loupe magnification.  Fat pad aggressive nicely.  There were no complicating features and distal release was confirmed.  I then dissected in a distal to proximal direction until the proximal leaflet was released in direct vision utilizing 4.3 expanded loupe magnification.  The patient had a very tight proximal fascial band and thus I made a counterincision at the distal wrist region transverse in nature and released this band is  well as well as antebrachial fascia.  The median nerve is intact hyperemic and there were no complicating features.  These areas were irrigated and ultimately closed after hemostasis was secured.  Following this I then performed a curvilinear incision posterior medially about the elbow.  Dissection was carried down the patient had soft tissue management as there was a lot of adiposity.  Plane was developed between the fascia and the subcu and following this the medial antebrachial cutaneous nerve was identified and kept out of harm's way and very carefully protected.  I then of course released the arcade of Struthers medial intermuscular septum and the cubital tunnel as well as Osborne's ligament and the 2 heads of the FCU.  Once this was complete I then excised the medial intermuscular septum and very gently mobilized the nerve on its epineurial vessels.  The patient tolerated this well and there were no complications.  Following mobilization the nerve set nice in a transposed state.  I obtained hemostasis and then performed a fascial release of the nerve would have nice gliding characteristics and no impingement.  This was a flexor pronator fascial release modified in nature.  Following this the patient had the cubital tunnel closed with Vicryl  I then tacked the subcutaneous tissue down to make sure the nerve is tension-free did not sublux and glided well.  I chose not to put a fascial sleeve around the nerve adjacent to the nerve as the nerve looked very well in the anterior state and with the cubital tunnel closed I had preplanned a bumper of subcutaneous tissue to avoid subluxation.  I was very pleased with this.  I deflated tourniquet secured  hemostasis and closed wound with Prolene.  Excellent refill was noted and there were no complicating features.  He will be monitored in the recovery room and DC'd home with instructions on oxycodone for pain Zofran for nausea and antibiotic in the form  of Keflex.  I should note he has had a childhood penicillin allergy but tolerated Keflex very well.  He had a nice sterile bandage and long-arm splint applied.  I will see him back in 2 weeks for standard follow-up and care plan  Davetta Olliff MD

## 2019-12-18 NOTE — Progress Notes (Signed)
Called Dr. Stephannie Peters and discussed elevated blood pressures.  She stated she will come see patient.

## 2019-12-18 NOTE — H&P (Signed)
Troy Rogers is an 44 y.o. male.   Chief Complaint: Left carpal and cubital tunnel syndrome advanced in nature with failure of conservative management. HPI: Patient presents for evaluation and treatment of the of their upper extremity predicament. The patient denies neck, back, chest or  abdominal pain. The patient notes that they have no lower extremity problems. The patients primary complaint is noted. We are planning surgical care pathway for the upper extremity. Patient complains of significant left carpal tunnel syndrome and left cubital tunnel syndrome significantly advanced in nature.  I discussed with him all issues plans and concerns. Past Medical History:  Diagnosis Date  . Anxiety   . Asthma   . Carpal tunnel syndrome on left   . Cellulitis   . Diabetes mellitus, type 2 (HCC)   . GERD (gastroesophageal reflux disease)    ocassional  . Gout   . Headache    hx migraines   . History of kidney stones    x 2  . Hypertension   . Irritable bowel syndrome (IBS)   . Obstructive sleep apnea    cpap   . Pneumonia     Past Surgical History:  Procedure Laterality Date  . Lumber fusion  06/2015   Lumbar 4  . STRABISMUS SURGERY Right 1983    Family History  Problem Relation Age of Onset  . Heart disease Mother   . COPD Mother   . Stroke Mother        TIA vs very mild  . COPD Father   . Heart attack Maternal Grandfather   . Heart attack Paternal Grandfather   . Cancer Maternal Grandmother        in throat  . Heart attack Maternal Grandmother   . Heart disease Paternal Grandmother    Social History:  reports that he has never smoked. He has never used smokeless tobacco. He reports previous alcohol use. He reports that he does not use drugs.  Allergies:  Allergies  Allergen Reactions  . Penicillins Hives    Has patient had a PCN reaction causing immediate rash, facial/tongue/throat swelling, SOB or lightheadedness with hypotension: Yes Has patient had a PCN reaction  causing severe rash involving mucus membranes or skin necrosis: No Has patient had a PCN reaction that required hospitalization No Has patient had a PCN reaction occurring within the last 10 years: No If all of the above answers are "NO", then may proceed with Cephalosporin use.     Medications Prior to Admission  Medication Sig Dispense Refill  . allopurinol (ZYLOPRIM) 300 MG tablet TAKE 1 TABLET(300 MG) BY MOUTH DAILY (Patient taking differently: Take 300 mg by mouth daily. ) 30 tablet 11  . carvedilol (COREG) 25 MG tablet TAKE 1 TABLET(25 MG) BY MOUTH TWICE DAILY WITH A MEAL (Patient taking differently: Take 25 mg by mouth daily. ) 180 tablet 3  . escitalopram (LEXAPRO) 20 MG tablet Take 20 mg by mouth at bedtime.    . furosemide (LASIX) 40 MG tablet Take 1 tablet (40 mg total) by mouth daily as needed. 90 tablet 3  . hydrochlorothiazide (HYDRODIURIL) 25 MG tablet Take 25 mg by mouth daily.    Marland Kitchen lisinopril (PRINIVIL,ZESTRIL) 40 MG tablet TAKE 1 TABLET(40 MG) BY MOUTH DAILY (Patient taking differently: Take 40 mg by mouth daily. ) 30 tablet 11  . metFORMIN (GLUCOPHAGE-XR) 500 MG 24 hr tablet Take 2 tablets (1,000 mg total) by mouth daily with breakfast. (Patient taking differently: Take 1,000 mg by mouth at  bedtime. ) 180 tablet 3  . gabapentin (NEURONTIN) 100 MG capsule Take 100 mg by mouth daily as needed (nerve pain.).     Marland Kitchen SUMAtriptan (IMITREX) 50 MG tablet Take 50 mg by mouth daily as needed for migraine.      Results for orders placed or performed during the hospital encounter of 12/18/19 (from the past 48 hour(s))  SARS Coronavirus 2 by RT PCR (hospital order, performed in [facility] hospital lab) Nasopharyngeal Nasopharyngeal Swab     Status: None   Collection Time: 12/18/19 11:17 AM   Specimen: Nasopharyngeal Swab  Result Value Ref Range   SARS Coronavirus 2 NEGATIVE NEGATIVE    Comment: (NOTE) SARS-CoV-2 target nucleic acids are NOT DETECTED.  The SARS-CoV-2 RNA is  generally detectable in upper and lower respiratory specimens during the acute phase of infection. The lowest concentration of SARS-CoV-2 viral copies this assay can detect is 250 copies / mL. A negative result does not preclude SARS-CoV-2 infection and should not be used as the sole basis for treatment or other patient management decisions.  A negative result may occur with improper specimen collection / handling, submission of specimen other than nasopharyngeal swab, presence of viral mutation(s) within the areas targeted by this assay, and inadequate number of viral copies (<250 copies / mL). A negative result must be combined with clinical observations, patient history, and epidemiological information.  Fact Sheet for Patients:   StrictlyIdeas.no  Fact Sheet for Healthcare Providers: BankingDealers.co.za  This test is not yet approved or  cleared by the [place] FDA and has been authorized for detection and/or diagnosis of SARS-CoV-2 by FDA under an Emergency Use Authorization (EUA).  This EUA will remain in effect (meaning this test can be used) for the duration of the COVID-19 declaration under Section 564(b)(1) of the Act, 21 U.S.C. section 360bbb-3(b)(1), unless the authorization is terminated or revoked sooner.  Performed at [facility], [place] [address], [place], [place] [address]   Glucose, capillary     Status: Abnormal   Collection Time: 12/18/19 11:21 AM  Result Value Ref Range   Glucose-Capillary 162 (H) 70 - 99 mg/dL    Comment: Glucose reference range applies only to samples taken after fasting for at least 8 hours.  CBC per protocol     Status: None   Collection Time: 12/18/19 11:22 AM  Result Value Ref Range   WBC 7.8 4.0 - 10.5 K/uL   RBC 4.72 4.22 - 5.81 MIL/uL   Hemoglobin 14.5 13.0 - 17.0 g/dL   HCT 43.5 39 - 52 %   MCV 92.2 80.0 - 100.0 fL   MCH 30.7 26.0 - 34.0 pg   MCHC 33.3 30.0 - 36.0 g/dL    RDW 13.2 11.5 - 15.5 %   Platelets 249 150 - 400 K/uL   nRBC 0.0 0.0 - 0.2 %    Comment: Performed at [facility], [place] [address], [place], [place] [address]  Glucose, capillary     Status: Abnormal   Collection Time: 12/18/19  1:28 PM  Result Value Ref Range   Glucose-Capillary 126 (H) 70 - 99 mg/dL    Comment: Glucose reference range applies only to samples taken after fasting for at least 8 hours.   No results found.  Review of Systems  Cardiovascular: Negative.   Gastrointestinal: Negative.   Genitourinary: Negative.     Blood pressure (!) 209/103, pulse 71, temperature 98.6 F (37 C), temperature source Oral, resp. rate 17, height  5\' 7"  (1.702 m), weight (!) 157.9 kg, SpO2 100 %. Physical Exam left upper extremity: Positive ulnar nerve compression test and median nerve compression test at the elbow and wrist with findings notable for cubital and carpal tunnel syndrome left upper extremity.  Were planning surgical decompression given the severity on objective exam electrodiagnostics and his subjective discomfort. Right upper extremity is fairly stable at this time. He understands risk and benefits.   The patient is alert and oriented in no acute distress. The patient complains of pain in the affected upper extremity.  The patient is noted to have a normal HEENT exam. Lung fields show equal chest expansion and no shortness of breath. Abdomen exam is nontender without distention. Lower extremity examination does not show any fracture dislocation or blood clot symptoms. Pelvis is stable and the neck and back are stable and nontender.  Assessment/Plan We are planning left carpal tunnel release and left ulnar nerve decompression and anterior transposition at the elbow with flexor pronator release as necessary. We are planning surgery for your upper extremity. The risk and benefits of surgery to include risk of bleeding, infection, anesthesia,  damage to normal structures  and failure of the surgery to accomplish its intended goals of relieving symptoms and restoring function have been discussed in detail. With this in mind we plan to proceed. I have specifically discussed with the patient the pre-and postoperative regime and the dos and don'ts and risk and benefits in great detail. Risk and benefits of surgery also include risk of dystrophy(CRPS), chronic nerve pain, failure of the healing process to go onto completion and other inherent risks of surgery The relavent the pathophysiology of the disease/injury process, as well as the alternatives for treatment and postoperative course of action has been discussed in great detail with the patient who desires to proceed.  We will do everything in our power to help you (the patient) restore function to the upper extremity. It is a pleasure to see this patient today.   III, MD 12/18/2019, 1:48 PM

## 2019-12-18 NOTE — Progress Notes (Signed)
Spoke with Dr. Amanda Pea to discuss Ancef order with PCN allergy.  Patient stated reaction was a rash in childhood.  Dr. Amanda Pea said OK to give Ancef 3g.

## 2019-12-18 NOTE — Anesthesia Postprocedure Evaluation (Signed)
Anesthesia Post Note  Patient: Troy Rogers  Procedure(s) Performed: Left carpal tunnel release, left ulnar nerve release and anterior transposition with flexor pronator lengthening as necessary (Left )     Patient location during evaluation: PACU Anesthesia Type: General Level of consciousness: awake and alert and oriented Pain management: pain level controlled Vital Signs Assessment: post-procedure vital signs reviewed and stable Respiratory status: spontaneous breathing, nonlabored ventilation and respiratory function stable Cardiovascular status: blood pressure returned to baseline Postop Assessment: no apparent nausea or vomiting Anesthetic complications: no   No complications documented.        Kaylyn Layer

## 2019-12-18 NOTE — Transfer of Care (Signed)
Immediate Anesthesia Transfer of Care Note  Patient: OZIAH VITANZA  Procedure(s) Performed: Left carpal tunnel release, left ulnar nerve release and anterior transposition with flexor pronator lengthening as necessary (Left )  Patient Location: PACU  Anesthesia Type:General  Level of Consciousness: drowsy and patient cooperative  Airway & Oxygen Therapy: Patient Spontanous Breathing and Patient connected to face mask oxygen  Post-op Assessment: Report given to RN and Post -op Vital signs reviewed and stable  Post vital signs: Reviewed and stable  Last Vitals:  Vitals Value Taken Time  BP 177/99 12/18/19 1543  Temp    Pulse 70   Resp 16 12/18/19 1545  SpO2 100   Vitals shown include unvalidated device data.  Last Pain:  Vitals:   12/18/19 1141  TempSrc:   PainSc: 0-No pain         Complications: No complications documented.

## 2019-12-18 NOTE — Anesthesia Procedure Notes (Signed)
Procedure Name: Intubation Date/Time: 12/18/2019 2:06 PM Performed by: Shary Decamp, CRNA Pre-anesthesia Checklist: Patient identified, Emergency Drugs available, Suction available and Patient being monitored Patient Re-evaluated:Patient Re-evaluated prior to induction Oxygen Delivery Method: Circle system utilized Preoxygenation: Pre-oxygenation with 100% oxygen Induction Type: IV induction Ventilation: Mask ventilation without difficulty Laryngoscope Size: Miller and 2 Grade View: Grade I Tube type: Oral Tube size: 7.5 mm Number of attempts: 1 Airway Equipment and Method: Stylet and Oral airway Placement Confirmation: ETT inserted through vocal cords under direct vision,  positive ETCO2 and breath sounds checked- equal and bilateral Secured at: 21 cm Tube secured with: Tape Dental Injury: Teeth and Oropharynx as per pre-operative assessment

## 2019-12-19 ENCOUNTER — Encounter (HOSPITAL_COMMUNITY): Payer: Self-pay | Admitting: Orthopedic Surgery

## 2019-12-31 DIAGNOSIS — M25622 Stiffness of left elbow, not elsewhere classified: Secondary | ICD-10-CM | POA: Diagnosis not present

## 2020-01-14 DIAGNOSIS — M25522 Pain in left elbow: Secondary | ICD-10-CM | POA: Diagnosis not present

## 2020-01-27 DIAGNOSIS — M25629 Stiffness of unspecified elbow, not elsewhere classified: Secondary | ICD-10-CM | POA: Diagnosis not present

## 2020-02-03 DIAGNOSIS — M25629 Stiffness of unspecified elbow, not elsewhere classified: Secondary | ICD-10-CM | POA: Diagnosis not present

## 2020-02-10 DIAGNOSIS — M25629 Stiffness of unspecified elbow, not elsewhere classified: Secondary | ICD-10-CM | POA: Diagnosis not present

## 2020-03-11 DIAGNOSIS — E1165 Type 2 diabetes mellitus with hyperglycemia: Secondary | ICD-10-CM | POA: Diagnosis not present

## 2020-03-11 DIAGNOSIS — R61 Generalized hyperhidrosis: Secondary | ICD-10-CM | POA: Diagnosis not present

## 2020-03-11 DIAGNOSIS — F4322 Adjustment disorder with anxiety: Secondary | ICD-10-CM | POA: Diagnosis not present

## 2020-03-11 DIAGNOSIS — I10 Essential (primary) hypertension: Secondary | ICD-10-CM | POA: Diagnosis not present

## 2020-03-11 DIAGNOSIS — G2581 Restless legs syndrome: Secondary | ICD-10-CM | POA: Diagnosis not present

## 2020-03-18 DIAGNOSIS — K219 Gastro-esophageal reflux disease without esophagitis: Secondary | ICD-10-CM | POA: Diagnosis not present

## 2020-03-18 DIAGNOSIS — B9689 Other specified bacterial agents as the cause of diseases classified elsewhere: Secondary | ICD-10-CM | POA: Diagnosis not present

## 2020-03-18 DIAGNOSIS — R05 Cough: Secondary | ICD-10-CM | POA: Diagnosis not present

## 2020-03-18 DIAGNOSIS — J208 Acute bronchitis due to other specified organisms: Secondary | ICD-10-CM | POA: Diagnosis not present

## 2020-04-11 DIAGNOSIS — F4322 Adjustment disorder with anxiety: Secondary | ICD-10-CM | POA: Diagnosis not present

## 2020-04-11 DIAGNOSIS — Z6841 Body Mass Index (BMI) 40.0 and over, adult: Secondary | ICD-10-CM | POA: Diagnosis not present

## 2020-05-04 DIAGNOSIS — E1165 Type 2 diabetes mellitus with hyperglycemia: Secondary | ICD-10-CM | POA: Diagnosis not present

## 2020-05-04 DIAGNOSIS — Z6841 Body Mass Index (BMI) 40.0 and over, adult: Secondary | ICD-10-CM | POA: Diagnosis not present

## 2020-05-04 DIAGNOSIS — F4322 Adjustment disorder with anxiety: Secondary | ICD-10-CM | POA: Diagnosis not present

## 2020-09-20 ENCOUNTER — Encounter: Payer: Self-pay | Admitting: *Deleted

## 2020-09-20 ENCOUNTER — Encounter: Payer: Self-pay | Admitting: Cardiology

## 2020-09-29 ENCOUNTER — Encounter: Payer: Self-pay | Admitting: Cardiology

## 2020-09-29 ENCOUNTER — Encounter: Payer: Self-pay | Admitting: *Deleted

## 2020-09-29 DIAGNOSIS — J45909 Unspecified asthma, uncomplicated: Secondary | ICD-10-CM | POA: Insufficient documentation

## 2020-09-29 DIAGNOSIS — I10 Essential (primary) hypertension: Secondary | ICD-10-CM | POA: Insufficient documentation

## 2020-09-29 DIAGNOSIS — G5602 Carpal tunnel syndrome, left upper limb: Secondary | ICD-10-CM | POA: Insufficient documentation

## 2020-09-29 DIAGNOSIS — M5387 Other specified dorsopathies, lumbosacral region: Secondary | ICD-10-CM | POA: Insufficient documentation

## 2020-09-29 DIAGNOSIS — E119 Type 2 diabetes mellitus without complications: Secondary | ICD-10-CM | POA: Insufficient documentation

## 2020-09-29 DIAGNOSIS — R0600 Dyspnea, unspecified: Secondary | ICD-10-CM | POA: Insufficient documentation

## 2020-09-29 DIAGNOSIS — Z87442 Personal history of urinary calculi: Secondary | ICD-10-CM | POA: Insufficient documentation

## 2020-09-29 DIAGNOSIS — R519 Headache, unspecified: Secondary | ICD-10-CM | POA: Insufficient documentation

## 2020-09-29 DIAGNOSIS — J189 Pneumonia, unspecified organism: Secondary | ICD-10-CM | POA: Insufficient documentation

## 2020-09-29 DIAGNOSIS — L0390 Cellulitis, unspecified: Secondary | ICD-10-CM | POA: Insufficient documentation

## 2020-09-29 DIAGNOSIS — K219 Gastro-esophageal reflux disease without esophagitis: Secondary | ICD-10-CM | POA: Insufficient documentation

## 2020-10-03 ENCOUNTER — Ambulatory Visit (INDEPENDENT_AMBULATORY_CARE_PROVIDER_SITE_OTHER): Payer: 59 | Admitting: Cardiology

## 2020-10-03 ENCOUNTER — Telehealth: Payer: Self-pay | Admitting: *Deleted

## 2020-10-03 ENCOUNTER — Encounter: Payer: Self-pay | Admitting: Cardiology

## 2020-10-03 ENCOUNTER — Other Ambulatory Visit: Payer: Self-pay

## 2020-10-03 VITALS — BP 150/104 | HR 119 | Ht 66.0 in | Wt 374.8 lb

## 2020-10-03 DIAGNOSIS — E119 Type 2 diabetes mellitus without complications: Secondary | ICD-10-CM

## 2020-10-03 DIAGNOSIS — G4733 Obstructive sleep apnea (adult) (pediatric): Secondary | ICD-10-CM

## 2020-10-03 DIAGNOSIS — I10 Essential (primary) hypertension: Secondary | ICD-10-CM

## 2020-10-03 DIAGNOSIS — R600 Localized edema: Secondary | ICD-10-CM | POA: Diagnosis not present

## 2020-10-03 DIAGNOSIS — R0600 Dyspnea, unspecified: Secondary | ICD-10-CM | POA: Diagnosis not present

## 2020-10-03 DIAGNOSIS — R0609 Other forms of dyspnea: Secondary | ICD-10-CM

## 2020-10-03 MED ORDER — FUROSEMIDE 40 MG PO TABS
ORAL_TABLET | ORAL | 3 refills | Status: DC
Start: 2020-10-03 — End: 2020-10-27

## 2020-10-03 NOTE — Telephone Encounter (Signed)
-----   Message from Reynolds Bowl, RN sent at 10/03/2020 10:30 AM EDT ----- Needs precert for sleep study.  Thank you,    [facility]

## 2020-10-03 NOTE — Progress Notes (Signed)
Cardiology Office Note:    Date:  10/03/2020   ID:  Troy Rogers, DOB Dec 23, 1975, MRN [id number]  PCP:  Buckner Malta, MD  Cardiologist:  Thomasene Ripple, DO  Electrophysiologist:  None   Referring MD: Buckner Malta, MD   I am having worsening shortness of breath my blood pressure is going up   History of Present Illness:    Troy Rogers is a 45 y.o. male with a hx of hypertension, uncontrolled diabetes, OSA not well treated with CPAP, morbid obesity, recent back injury is here today to establish cardiac care.  The patient was referred by his primary care provider to be evaluated for uncontrolled hypertension as well as bilateral leg edema.  The patient tells me over the last several weeks he has had significant high blood pressure than usual.  He notes that his leg is also swelling and this is new.  He has been short of breath.  He tells me that he had an injury at work not too long ago and has been under a lot of stress given the fact that he had back injury once he reported he was dismissed from work.  He has been going to physical therapy.  But note that his blood pressure has also been high.  He followed with his PCP recently and he was placed on increased lisinopril, hydrochlorothiazide as well as clonidine 0.2 mg twice daily and carvedilol.  All of these have not helped with his blood pressure.  He does not report any chest pain.  He denies any lightheaded or dizziness.   Past Medical History:  Diagnosis Date  . Ankle contracture, left 07/02/2016  . Anxiety   . Asthma   . Carpal tunnel syndrome on left   . Cellulitis   . Controlled type 2 diabetes mellitus without complication, without long-term current use of insulin (HCC)   . Diabetes mellitus, type 2 (HCC)   . Dyspnea   . Essential hypertension 10/13/2014  . GERD (gastroesophageal reflux disease)    ocassional  . Gout   . Headache    hx migraines   . History of kidney stones    x 2  . Hypertension   . Irritable  bowel syndrome (IBS)   . Lumbar radiculopathy 04/27/2015  . Morbid obesity (HCC) 10/13/2014  . Nephrolithiasis   . Obstructive sleep apnea    cpap   . Plantar fasciitis of left foot 07/02/2016  . Pneumonia   . Sciatica associated with disorder of lumbosacral spine   . Spondylolisthesis at L4-L5 level 07/19/2015    Past Surgical History:  Procedure Laterality Date  . CARPAL TUNNEL WITH CUBITAL TUNNEL Left 12/18/2019   Procedure: Left carpal tunnel release, left ulnar nerve release and anterior transposition with flexor pronator lengthening as necessary;  Surgeon: Dominica Severin, MD;  Location: [facility] OR;  Service: Orthopedics;  Laterality: Left;  2 hrs  . Lumber fusion  06/2015   Lumbar 4  . STRABISMUS SURGERY Right 1983    Current Medications: Current Meds  Medication Sig  . acetaminophen (TYLENOL) 500 MG tablet Take 500 mg by mouth as needed for moderate pain.  Marland Kitchen allopurinol (ZYLOPRIM) 300 MG tablet Take 300 mg by mouth daily.  Marland Kitchen amitriptyline (ELAVIL) 75 MG tablet Take 75 mg by mouth at bedtime.  Marland Kitchen buPROPion (WELLBUTRIN SR) 150 MG 12 hr tablet Take 150 mg by mouth daily.  . carvedilol (COREG) 25 MG tablet Take 25 mg by mouth 2 (two) times daily with a meal.  .  cloNIDine (CATAPRES) 0.1 MG tablet Take 0.1 mg by mouth 2 (two) times daily.  . Dulaglutide (TRULICITY) 0.75 MG/0.5ML SOPN Inject 0.75 mg into the skin daily.  Marland Kitchen escitalopram (LEXAPRO) 20 MG tablet Take 20 mg by mouth at bedtime.  . furosemide (LASIX) 40 MG tablet Take 40 mg twice daily for 7 days than 40 mg once daily  . gabapentin (NEURONTIN) 300 MG capsule Take 300 mg by mouth 2 (two) times daily as needed (pain and numbess).  . hydrochlorothiazide (HYDRODIURIL) 25 MG tablet Take 25 mg by mouth daily.  Marland Kitchen lisinopril (ZESTRIL) 40 MG tablet Take 40 mg by mouth in the morning and at bedtime.  . metFORMIN (GLUMETZA) 500 MG (MOD) 24 hr tablet Take 500 mg by mouth 2 (two) times daily with a meal. With evening meal  . omeprazole  (PRILOSEC) 40 MG capsule Take 40 mg by mouth at bedtime.  Marland Kitchen Ubrogepant (UBRELVY) 50 MG TABS Take 50 mg by mouth daily as needed (Migraine).     Allergies:   Penicillins   Social History   Socioeconomic History  . Marital status: Married    Spouse name: Not on file  . Number of children: 0  . Years of education: Not on file  . Highest education level: Not on file  Occupational History  . Occupation: Apartment maintenance  Tobacco Use  . Smoking status: Never Smoker  . Smokeless tobacco: Never Used  Substance and Sexual Activity  . Alcohol use: Not Currently    Comment: Rare  . Drug use: No  . Sexual activity: Not on file  Other Topics Concern  . Not on file  Social History Narrative   Remarried June 2017   Social Determinants of Health   Financial Resource Strain: Not on file  Food Insecurity: Not on file  Transportation Needs: Not on file  Physical Activity: Not on file  Stress: Not on file  Social Connections: Not on file     Family History: The patient's family history includes COPD in his father and mother; Cancer in his maternal grandmother; Heart attack in his maternal grandfather, maternal grandmother, paternal grandfather, and paternal grandmother; Heart disease in his mother and paternal grandmother; Hypertension in his father, maternal grandfather, maternal grandmother, mother, paternal grandfather, and paternal grandmother; Stroke in his mother; Throat cancer in his maternal grandfather.  ROS:   Review of Systems  Constitution: Negative for decreased appetite, fever and weight gain.  HENT: Negative for congestion, ear discharge, hoarse voice and sore throat.   Eyes: Negative for discharge, redness, vision loss in right eye and visual halos.  Cardiovascular: Reports dyspnea on exertion, leg swelling, orthopnea.  Negative for chest pain and palpitations.  Respiratory: Negative for cough, hemoptysis, shortness of breath and snoring.   Endocrine: Negative for  heat intolerance and polyphagia.  Hematologic/Lymphatic: Negative for bleeding problem. Does not bruise/bleed easily.  Skin: Negative for flushing, nail changes, rash and suspicious lesions.  Musculoskeletal: Reports back pain negative for arthritis, joint pain, muscle cramps, myalgias, neck pain and stiffness.  Gastrointestinal: Negative for abdominal pain, bowel incontinence, diarrhea and excessive appetite.  Genitourinary: Negative for decreased libido, genital sores and incomplete emptying.  Neurological: Negative for brief paralysis, focal weakness, headaches and loss of balance.  Psychiatric/Behavioral: Negative for altered mental status, depression and suicidal ideas.  Allergic/Immunologic: Negative for HIV exposure and persistent infections.    EKGs/Labs/Other Studies Reviewed:    The following studies were reviewed today:   EKG:  The ekg ordered today demonstrates sinus  tachycardia, heart rate 119 bpm with poor R wave progression in the precordial leads.   Cardiogram done in 2015  Study Conclusions   - Left ventricle: The cavity size was normal. There was mild  concentric hypertrophy. Systolic function was normal. The  estimated ejection fraction was in the range of 55% to 60%.  Although no diagnostic regional wall motion abnormality was  identified, this possibility cannot be completely excluded on the  basis of this study. Doppler parameters are consistent with  abnormal left ventricular relaxation (grade 1 diastolic  dysfunction).  - Left atrium: The atrium was moderately dilated.    Recent Labs: 12/18/2019: BUN 15; Creatinine, Ser 1.11; Hemoglobin 14.5; Platelets 249; Potassium 3.2; Sodium 140  Recent Lipid Panel    Component Value Date/Time   CHOL 155 01/23/2018 1542   TRIG 118.0 01/23/2018 1542   HDL 44.90 01/23/2018 1542   CHOLHDL 3 01/23/2018 1542   VLDL 23.6 01/23/2018 1542   LDLCALC 87 01/23/2018 1542    Physical Exam:    VS:  BP (!)  150/104 (BP Location: Right Arm)   Pulse (!) 119   Ht  (1.676 m)   Wt (!) 374 lb 12.8 oz (170 kg)   SpO2 96%   BMI 60.49 kg/m     Wt Readings from Last 3 Encounters:  10/03/20 (!) 374 lb 12.8 oz (170 kg)  09/19/20 (!) 376 lb (170.6 kg)  09/15/20 (!) 376 lb (170.6 kg)     GEN: Morbidly obese, well nourished, well developed in no acute distress HEENT: Normal NECK: No JVD; No carotid bruits LYMPHATICS: No lymphadenopathy CARDIAC: S1S2 noted,RRR, no murmurs, rubs, gallops RESPIRATORY:  Clear to auscultation without rales, wheezing or rhonchi  ABDOMEN: Soft, non-tender, non-distended, +bowel sounds, no guarding. EXTREMITIES: +3 bilateral pretibial pitting edema, No cyanosis, no clubbing MUSCULOSKELETAL:  No deformity  SKIN: Warm and dry NEUROLOGIC:  Alert and oriented x 3, non-focal PSYCHIATRIC:  Normal affect, good insight  ASSESSMENT:    1. DOE (dyspnea on exertion)   2. Bilateral leg edema   3. Essential hypertension   4. Obstructive sleep apnea   5. Controlled type 2 diabetes mellitus without complication, without long-term current use of insulin (HCC)   6. Morbid obesity (HCC)    PLAN:     1.  He does have significant bilateral leg edema.  Like to start the patient on Lasix 40 mg twice a day with potassium supplement..  Take this for 7 days and I will see the patient back in 1 week.  We do need to repeat his echocardiogram to make sure there is no change in his left ventricular systolic function since 2015.  He is hypertensive in the office today, I am hoping that this can improve after diuresing the patient.  If not we will make adjustments to his antihypertensive regimen.  The patient understands the need to lose weight with diet and exercise. We have discussed specific strategies for this.  This is very limiting by the fact that he is experiencing significant back pain.  He has been diagnosed with sleep apnea 4 years ago and started on a CPAP but since then he has  had significant weight gain and has not been able to use his CPAP appropriately.  I think he needs a new sleep study to reassess his settings for CPAP.   The patient is in agreement with the above plan. The patient left the office in stable condition.  The patient will follow up in  1 week or sooner if needed.   Medication Adjustments/Labs and Tests Ordered: Current medicines are reviewed at length with the patient today.  Concerns regarding medicines are outlined above.  Orders Placed This Encounter  Procedures  . Basic metabolic panel  . Magnesium  . Pro b natriuretic peptide (BNP)  . EKG 12-Lead  . ECHOCARDIOGRAM COMPLETE  . Split night study   Meds ordered this encounter  Medications  . furosemide (LASIX) 40 MG tablet    Sig: Take 40 mg twice daily for 7 days than 40 mg once daily    Dispense:  90 tablet    Refill:  3    Patient Instructions   Medication Instructions:  Your physician has recommended you make the following change in your medication:  START: Lasix 40 mg twice daily for 7 days than 40 mg once daily *If you need a refill on your cardiac medications before your next appointment, please call your pharmacy*   Lab Work: Your physician recommends that you return for lab work in: TODAY: BMET, Mag, BNP If you have labs (blood work) drawn today and your tests are completely normal, you will receive your results only by: Marland Kitchen MyChart Message (if you have MyChart) OR . A paper copy in the mail If you have any lab test that is abnormal or we need to change your treatment, we will call you to review the results.   Testing/Procedures: Your physician has requested that you have an echocardiogram. Echocardiography is a painless test that uses sound waves to create images of your heart. It provides your doctor with information about the size and shape of your heart and how well your heart's chambers and valves are working. This procedure takes approximately one hour. There are  no restrictions for this procedure.  Your physician has recommended that you have a sleep study. This test records several body functions during sleep, including: brain activity, eye movement, oxygen and carbon dioxide blood levels, heart rate and rhythm, breathing rate and rhythm, the flow of air through your mouth and nose, snoring, body muscle movements, and chest and belly movement.    Follow-Up: At [facility], you and your health needs are our priority.  As part of our continuing mission to provide you with exceptional heart care, we have created designated Provider Care Teams.  These Care Teams include your primary Cardiologist (physician) and Advanced Practice Providers (APPs -  Physician Assistants and Nurse Practitioners) who all work together to provide you with the care you need, when you need it.  We recommend signing up for the patient portal called "MyChart".  Sign up information is provided on this After Visit Summary.  MyChart is used to connect with patients for Virtual Visits (Telemedicine).  Patients are able to view lab/test results, encounter notes, upcoming appointments, etc.  Non-urgent messages can be sent to your provider as well.   To learn more about what you can do with MyChart, go to [email or web address].    Your next appointment:   1 week(s)  The format for your next appointment:   In Person  Provider:   Thomasene Ripple, DO   Other Instructions   Sleep Study, Adult A sleep study (polysomnogram) is a series of tests done while you are sleeping. A sleep study records your brain waves, heart rate, breathing rate, oxygen level, and eye and leg movements. A sleep study helps your health care provider:  See how well you sleep.  Diagnose a sleep disorder.  Determine how  severe your sleep disorder is.  Create a plan to treat your sleep disorder. Your health care provider may recommend a sleep study if you:  Feel sleepy on most days.  Snore loudly while  sleeping.  Have unusual behaviors while you sleep, such as walking.  Have brief periods in which you stop breathing during sleep (sleepapnea).  Fall asleep suddenly during the day (narcolepsy).  Have trouble falling asleep or staying asleep (insomnia).  Feel like you need to move your legs when trying to fall asleep (restless legs syndrome).  Move your legs by flexing and extending them regularly while asleep (periodic limb movement disorder).  Act out your dreams while you sleep (sleep behavior disorder).  Feel like you cannot move when you first wake up (sleep paralysis). What tests are part of a sleep study? Most sleep studies record the following during sleep:  Brain activity.  Eye movements.  Heart rate and rhythm.  Breathing rate and rhythm.  Blood-oxygen level.  Blood pressure.  Chest and belly movement as you breathe.  Arm and leg movements.  Snoring or other noises.  Body position. Where are sleep studies done? Sleep studies are done at sleep centers. A sleep center may be inside a hospital, office, or clinic. The room where you have the study may look like a hospital room or a hotel room. The health care providers doing the study may come in and out of the room during the study. Most of the time, they will be in another room monitoring your test as you sleep. How are sleep studies done? Most sleep studies are done during a normal period of time for a full night of sleep. You will arrive at the study center in the evening and go home in the morning. Before the test  Bring your pajamas and toothbrush with you to the sleep study.  Do not have caffeine on the day of your sleep study.  Do not drink alcohol on the day of your sleep study.  Your health care provider will let you know if you should stop taking any of your regular medicines before the test. During the test  Round, sticky patches with sensors attached to recording wires (electrodes) are placed  on your scalp, face, chest, and limbs.  Wires from all the electrodes and sensors run from your bed to a computer. The wires can be taken off and put back on if you need to get out of bed to go to the bathroom.  A sensor is placed over your nose to measure airflow.  A finger clip is put on your finger or ear to measure your blood oxygen level (pulse oximetry).  A belt is placed around your belly and a belt is placed around your chest to measure breathing movements.  If you have signs of the sleep disorder called sleep apnea during your test, you may get a treatment mask to wear for the second half of the night. ? The mask provides positive airway pressure (PAP) to help you breathe better during sleep. This may greatly improve your sleep apnea. ? You will then have all tests done again with the mask in place to see if your measurements and recordings change.      After the test  A medical doctor who specializes in sleep will evaluate the results of your sleep study and share them with you and your primary health care provider.  Based on your results, your medical history, and a physical exam, you may  be diagnosed with a sleep disorder, such as: ? Sleep apnea. ? Restless legs syndrome. ? Sleep-related behavior disorder. ? Sleep-related movement disorders. ? Sleep-related seizure disorders.  Your health care team will help determine your treatment options based on your diagnosis. This may include: ? Improving your sleep habits (sleep hygiene). ? Wearing a continuous positive airway pressure (CPAP) or bi-level positive airway pressure (BPAP) mask. ? Wearing an oral device at night to improve breathing and reduce snoring. ? Taking medicines. Follow these instructions at home:  Take over-the-counter and prescription medicines only as told by your health care provider.  If you are instructed to use a CPAP or BPAP mask, make sure you use it nightly as directed.  Make any lifestyle  changes that your health care provider recommends.  If you were given a device to open your airway while you sleep, use it only as told by your health care provider.  Do not use any tobacco products, such as cigarettes, chewing tobacco, and e-cigarettes. If you need help quitting, ask your health care provider.  Keep all follow-up visits as told by your health care provider. This is important. Summary  A sleep study (polysomnogram) is a series of tests done while you are sleeping. It shows how well you sleep.  Most sleep studies are done over one full night of sleep. You will arrive at the study center in the evening and go home in the morning.  If you have signs of the sleep disorder called sleep apnea during your test, you may get a treatment mask to wear for the second half of the night.  A medical doctor who specializes in sleep will evaluate the results of your sleep study and share them with your primary health care provider. This information is not intended to replace advice given to you by your health care provider. Make sure you discuss any questions you have with your health care provider. Document Revised: 07/17/2019 Document Reviewed: 07/09/2017 Elsevier Patient Education  2021 Elsevier Inc.  Echocardiogram An echocardiogram is a test that uses sound waves (ultrasound) to produce images of the heart. Images from an echocardiogram can provide important information about:  Heart size and shape.  The size and thickness and movement of your heart's walls.  Heart muscle function and strength.  Heart valve function or if you have stenosis. Stenosis is when the heart valves are too narrow.  If blood is flowing backward through the heart valves (regurgitation).  A tumor or infectious growth around the heart valves.  Areas of heart muscle that are not working well because of poor blood flow or injury from a heart attack.  Aneurysm detection. An aneurysm is a weak or damaged  part of an artery wall. The wall bulges out from the normal force of blood pumping through the body. Tell a health care provider about:  Any allergies you have.  All medicines you are taking, including vitamins, herbs, eye drops, creams, and over-the-counter medicines.  Any blood disorders you have.  Any surgeries you have had.  Any medical conditions you have.  Whether you are pregnant or may be pregnant. What are the risks? Generally, this is a safe test. However, problems may occur, including an allergic reaction to dye (contrast) that may be used during the test. What happens before the test? No specific preparation is needed. You may eat and drink normally. What happens during the test?  You will take off your clothes from the waist up and put on a  hospital gown.  Electrodes or electrocardiogram (ECG)patches may be placed on your chest. The electrodes or patches are then connected to a device that monitors your heart rate and rhythm.  You will lie down on a table for an ultrasound exam. A gel will be applied to your chest to help sound waves pass through your skin.  A handheld device, called a transducer, will be pressed against your chest and moved over your heart. The transducer produces sound waves that travel to your heart and bounce back (or "echo" back) to the transducer. These sound waves will be captured in real-time and changed into images of your heart that can be viewed on a video monitor. The images will be recorded on a computer and reviewed by your health care provider.  You may be asked to change positions or hold your breath for a short time. This makes it easier to get different views or better views of your heart.  In some cases, you may receive contrast through an IV in one of your veins. This can improve the quality of the pictures from your heart. The procedure may vary among health care providers and hospitals.   What can I expect after the test? You may  return to your normal, everyday life, including diet, activities, and medicines, unless your health care provider tells you not to do that. Follow these instructions at home:  It is up to you to get the results of your test. Ask your health care provider, or the department that is doing the test, when your results will be ready.  Keep all follow-up visits. This is important. Summary  An echocardiogram is a test that uses sound waves (ultrasound) to produce images of the heart.  Images from an echocardiogram can provide important information about the size and shape of your heart, heart muscle function, heart valve function, and other possible heart problems.  You do not need to do anything to prepare before this test. You may eat and drink normally.  After the echocardiogram is completed, you may return to your normal, everyday life, unless your health care provider tells you not to do that. This information is not intended to replace advice given to you by your health care provider. Make sure you discuss any questions you have with your health care provider. Document Revised: 02/02/2020 Document Reviewed: 02/02/2020 Elsevier Patient Education  2021 Elsevier Inc.     Adopting a Healthy Lifestyle.  Know what a healthy weight is for you (roughly BMI <25) and aim to maintain this   Aim for 7+ servings of fruits and vegetables daily   65-80+ fluid ounces of water or unsweet tea for healthy kidneys   Limit to max 1 drink of alcohol per day; avoid smoking/tobacco   Limit animal fats in diet for cholesterol and heart health - choose grass fed whenever available   Avoid highly processed foods, and foods high in saturated/trans fats   Aim for low stress - take time to unwind and care for your mental health   Aim for 150 min of moderate intensity exercise weekly for heart health, and weights twice weekly for bone health   Aim for 7-9 hours of sleep daily   When it comes to diets,  agreement about the perfect plan isnt easy to find, even among the experts. Experts at the [employer] developed an idea known as the Healthy Eating Plate. Just imagine a plate divided into logical, healthy portions.   The  emphasis is on diet quality:   Load up on vegetables and fruits - one-half of your plate: Aim for color and variety, and remember that potatoes dont count.   Go for whole grains - one-quarter of your plate: Whole wheat, barley, wheat berries, quinoa, oats, brown rice, and foods made with them. If you want pasta, go with whole wheat pasta.   Protein power - one-quarter of your plate: Fish, chicken, beans, and nuts are all healthy, versatile protein sources. Limit red meat.   The diet, however, does go beyond the plate, offering a few other suggestions.   Use healthy plant oils, such as olive, canola, soy, corn, sunflower and peanut. Check the labels, and avoid partially hydrogenated oil, which have unhealthy trans fats.   If youre thirsty, drink water. Coffee and tea are good in moderation, but skip sugary drinks and limit milk and dairy products to one or two daily servings.   The type of carbohydrate in the diet is more important than the amount. Some sources of carbohydrates, such as vegetables, fruits, whole grains, and beans-are healthier than others.   Finally, stay active  Signed, Thomasene Ripple, DO  10/03/2020 12:29 PM    [facility]

## 2020-10-03 NOTE — Patient Instructions (Addendum)
Medication Instructions:  Your physician has recommended you make the following change in your medication:  START: Lasix 40 mg twice daily for 7 days than 40 mg once daily *If you need a refill on your cardiac medications before your next appointment, please call your pharmacy*   Lab Work: Your physician recommends that you return for lab work in: TODAY: BMET, Mag, BNP If you have labs (blood work) drawn today and your tests are completely normal, you will receive your results only by: Marland Kitchen. MyChart Message (if you have MyChart) OR . A paper copy in the mail If you have any lab test that is abnormal or we need to change your treatment, we will call you to review the results.   Testing/Procedures: Your physician has requested that you have an echocardiogram. Echocardiography is a painless test that uses sound waves to create images of your heart. It provides your doctor with information about the size and shape of your heart and how well your heart's chambers and valves are working. This procedure takes approximately one hour. There are no restrictions for this procedure.  Your physician has recommended that you have a sleep study. This test records several body functions during sleep, including: brain activity, eye movement, oxygen and carbon dioxide blood levels, heart rate and rhythm, breathing rate and rhythm, the flow of air through your mouth and nose, snoring, body muscle movements, and chest and belly movement.    Follow-Up: [facility], you and your health needs are our priority.  As part of our continuing mission to provide you with exceptional heart care, we have created designated Provider Care Teams.  These Care Teams include your primary Cardiologist (physician) and Advanced Practice Providers (APPs -  Physician Assistants and Nurse Practitioners) who all work together to provide you with the care you need, when you need it.  We recommend signing up for the patient portal  called "MyChart".  Sign up information is provided on this After Visit Summary.  MyChart is used to connect with patients for Virtual Visits (Telemedicine).  Patients are able to view lab/test results, encounter notes, upcoming appointments, etc.  Non-urgent messages can be sent to your provider as well.   To learn more about what you can do with MyChart, go to [email or web address].    Your next appointment:   1 week(s)  The format for your next appointment:   In Person  Provider:   Thomasene RippleKardie Tobb, DO   Other Instructions   Sleep Study, Adult A sleep study (polysomnogram) is a series of tests done while you are sleeping. A sleep study records your brain waves, heart rate, breathing rate, oxygen level, and eye and leg movements. A sleep study helps your health care provider:  See how well you sleep.  Diagnose a sleep disorder.  Determine how severe your sleep disorder is.  Create a plan to treat your sleep disorder. Your health care provider may recommend a sleep study if you:  Feel sleepy on most days.  Snore loudly while sleeping.  Have unusual behaviors while you sleep, such as walking.  Have brief periods in which you stop breathing during sleep (sleepapnea).  Fall asleep suddenly during the day (narcolepsy).  Have trouble falling asleep or staying asleep (insomnia).  Feel like you need to move your legs when trying to fall asleep (restless legs syndrome).  Move your legs by flexing and extending them regularly while asleep (periodic limb movement disorder).  Act out your dreams while you sleep (sleep  behavior disorder).  Feel like you cannot move when you first wake up (sleep paralysis). What tests are part of a sleep study? Most sleep studies record the following during sleep:  Brain activity.  Eye movements.  Heart rate and rhythm.  Breathing rate and rhythm.  Blood-oxygen level.  Blood pressure.  Chest and belly movement as you breathe.  Arm and  leg movements.  Snoring or other noises.  Body position. Where are sleep studies done? Sleep studies are done at sleep centers. A sleep center may be inside a hospital, office, or clinic. The room where you have the study may look like a hospital room or a hotel room. The health care providers doing the study may come in and out of the room during the study. Most of the time, they will be in another room monitoring your test as you sleep. How are sleep studies done? Most sleep studies are done during a normal period of time for a full night of sleep. You will arrive at the study center in the evening and go home in the morning. Before the test  Bring your pajamas and toothbrush with you to the sleep study.  Do not have caffeine on the day of your sleep study.  Do not drink alcohol on the day of your sleep study.  Your health care provider will let you know if you should stop taking any of your regular medicines before the test. During the test  Round, sticky patches with sensors attached to recording wires (electrodes) are placed on your scalp, face, chest, and limbs.  Wires from all the electrodes and sensors run from your bed to a computer. The wires can be taken off and put back on if you need to get out of bed to go to the bathroom.  A sensor is placed over your nose to measure airflow.  A finger clip is put on your finger or ear to measure your blood oxygen level (pulse oximetry).  A belt is placed around your belly and a belt is placed around your chest to measure breathing movements.  If you have signs of the sleep disorder called sleep apnea during your test, you may get a treatment mask to wear for the second half of the night. ? The mask provides positive airway pressure (PAP) to help you breathe better during sleep. This may greatly improve your sleep apnea. ? You will then have all tests done again with the mask in place to see if your measurements and recordings change.       After the test  A medical doctor who specializes in sleep will evaluate the results of your sleep study and share them with you and your primary health care provider.  Based on your results, your medical history, and a physical exam, you may be diagnosed with a sleep disorder, such as: ? Sleep apnea. ? Restless legs syndrome. ? Sleep-related behavior disorder. ? Sleep-related movement disorders. ? Sleep-related seizure disorders.  Your health care team will help determine your treatment options based on your diagnosis. This may include: ? Improving your sleep habits (sleep hygiene). ? Wearing a continuous positive airway pressure (CPAP) or bi-level positive airway pressure (BPAP) mask. ? Wearing an oral device at night to improve breathing and reduce snoring. ? Taking medicines. Follow these instructions at home:  Take over-the-counter and prescription medicines only as told by your health care provider.  If you are instructed to use a CPAP or BPAP mask, make sure you  use it nightly as directed.  Make any lifestyle changes that your health care provider recommends.  If you were given a device to open your airway while you sleep, use it only as told by your health care provider.  Do not use any tobacco products, such as cigarettes, chewing tobacco, and e-cigarettes. If you need help quitting, ask your health care provider.  Keep all follow-up visits as told by your health care provider. This is important. Summary  A sleep study (polysomnogram) is a series of tests done while you are sleeping. It shows how well you sleep.  Most sleep studies are done over one full night of sleep. You will arrive at the study center in the evening and go home in the morning.  If you have signs of the sleep disorder called sleep apnea during your test, you may get a treatment mask to wear for the second half of the night.  A medical doctor who specializes in sleep will evaluate the results of  your sleep study and share them with your primary health care provider. This information is not intended to replace advice given to you by your health care provider. Make sure you discuss any questions you have with your health care provider. Document Revised: 07/17/2019 Document Reviewed: 07/09/2017 Elsevier Patient Education  2021 [employer]  Echocardiogram An echocardiogram is a test that uses sound waves (ultrasound) to produce images of the heart. Images from an echocardiogram can provide important information about:  Heart size and shape.  The size and thickness and movement of your heart's walls.  Heart muscle function and strength.  Heart valve function or if you have stenosis. Stenosis is when the heart valves are too narrow.  If blood is flowing backward through the heart valves (regurgitation).  A tumor or infectious growth around the heart valves.  Areas of heart muscle that are not working well because of poor blood flow or injury from a heart attack.  Aneurysm detection. An aneurysm is a weak or damaged part of an artery wall. The wall bulges out from the normal force of blood pumping through the body. Tell a health care provider about:  Any allergies you have.  All medicines you are taking, including vitamins, herbs, eye drops, creams, and over-the-counter medicines.  Any blood disorders you have.  Any surgeries you have had.  Any medical conditions you have.  Whether you are pregnant or may be pregnant. What are the risks? Generally, this is a safe test. However, problems may occur, including an allergic reaction to dye (contrast) that may be used during the test. What happens before the test? No specific preparation is needed. You may eat and drink normally. What happens during the test?  You will take off your clothes from the waist up and put on a hospital gown.  Electrodes or electrocardiogram (ECG)patches may be placed on your chest. The  electrodes or patches are then connected to a device that monitors your heart rate and rhythm.  You will lie down on a table for an ultrasound exam. A gel will be applied to your chest to help sound waves pass through your skin.  A handheld device, called a transducer, will be pressed against your chest and moved over your heart. The transducer produces sound waves that travel to your heart and bounce back (or "echo" back) to the transducer. These sound waves will be captured in real-time and changed into images of your heart that can be viewed on a video  monitor. The images will be recorded on a computer and reviewed by your health care provider.  You may be asked to change positions or hold your breath for a short time. This makes it easier to get different views or better views of your heart.  In some cases, you may receive contrast through an IV in one of your veins. This can improve the quality of the pictures from your heart. The procedure may vary among health care providers and hospitals.   What can I expect after the test? You may return to your normal, everyday life, including diet, activities, and medicines, unless your health care provider tells you not to do that. Follow these instructions at home:  It is up to you to get the results of your test. Ask your health care provider, or the department that is doing the test, when your results will be ready.  Keep all follow-up visits. This is important. Summary  An echocardiogram is a test that uses sound waves (ultrasound) to produce images of the heart.  Images from an echocardiogram can provide important information about the size and shape of your heart, heart muscle function, heart valve function, and other possible heart problems.  You do not need to do anything to prepare before this test. You may eat and drink normally.  After the echocardiogram is completed, you may return to your normal, everyday life, unless your health  care provider tells you not to do that. This information is not intended to replace advice given to you by your health care provider. Make sure you discuss any questions you have with your health care provider. Document Revised: 02/02/2020 Document Reviewed: 02/02/2020 Elsevier Patient Education  2021 [employer].

## 2020-10-04 LAB — BASIC METABOLIC PANEL
BUN/Creatinine Ratio: 14 (ref 9–20)
BUN: 19 mg/dL (ref 6–24)
CO2: 25 mmol/L (ref 20–29)
Calcium: 9.3 mg/dL (ref 8.7–10.2)
Chloride: 96 mmol/L (ref 96–106)
Creatinine, Ser: 1.34 mg/dL — ABNORMAL HIGH (ref 0.76–1.27)
Glucose: 219 mg/dL — ABNORMAL HIGH (ref 65–99)
Potassium: 4.3 mmol/L (ref 3.5–5.2)
Sodium: 138 mmol/L (ref 134–144)
eGFR: 67 mL/min/{1.73_m2} (ref >59)

## 2020-10-04 LAB — PRO B NATRIURETIC PEPTIDE: NT-Pro BNP: 223 pg/mL — ABNORMAL HIGH (ref 0–86)

## 2020-10-04 LAB — MAGNESIUM: Magnesium: 1.8 mg/dL (ref 1.6–2.3)

## 2020-10-05 ENCOUNTER — Telehealth: Payer: Self-pay

## 2020-10-05 MED ORDER — POTASSIUM CHLORIDE CRYS ER 20 MEQ PO TBCR
20.0000 meq | EXTENDED_RELEASE_TABLET | Freq: Every day | ORAL | 3 refills | Status: DC
Start: 2020-10-05 — End: 2021-06-27

## 2020-10-05 MED ORDER — METOLAZONE 5 MG PO TABS
5.0000 mg | ORAL_TABLET | Freq: Every day | ORAL | 0 refills | Status: DC
Start: 2020-10-05 — End: 2020-10-27

## 2020-10-05 NOTE — Telephone Encounter (Signed)
Spoke with patient about starting potassium since he is taking Lasix. He verbalized understanding, no questions at this time.

## 2020-10-10 ENCOUNTER — Ambulatory Visit: Payer: 59 | Admitting: Cardiology

## 2020-10-11 ENCOUNTER — Ambulatory Visit: Payer: 59 | Admitting: Cardiology

## 2020-10-27 ENCOUNTER — Ambulatory Visit (INDEPENDENT_AMBULATORY_CARE_PROVIDER_SITE_OTHER): Payer: 59 | Admitting: Cardiology

## 2020-10-27 ENCOUNTER — Encounter: Payer: Self-pay | Admitting: Cardiology

## 2020-10-27 ENCOUNTER — Ambulatory Visit (INDEPENDENT_AMBULATORY_CARE_PROVIDER_SITE_OTHER): Payer: 59

## 2020-10-27 ENCOUNTER — Other Ambulatory Visit: Payer: Self-pay

## 2020-10-27 VITALS — BP 158/118 | HR 90 | Ht 66.0 in | Wt 365.2 lb

## 2020-10-27 DIAGNOSIS — I10 Essential (primary) hypertension: Secondary | ICD-10-CM

## 2020-10-27 DIAGNOSIS — E119 Type 2 diabetes mellitus without complications: Secondary | ICD-10-CM | POA: Diagnosis not present

## 2020-10-27 DIAGNOSIS — R0609 Other forms of dyspnea: Secondary | ICD-10-CM

## 2020-10-27 DIAGNOSIS — G4733 Obstructive sleep apnea (adult) (pediatric): Secondary | ICD-10-CM

## 2020-10-27 DIAGNOSIS — R0600 Dyspnea, unspecified: Secondary | ICD-10-CM | POA: Diagnosis not present

## 2020-10-27 DIAGNOSIS — R0989 Other specified symptoms and signs involving the circulatory and respiratory systems: Secondary | ICD-10-CM

## 2020-10-27 LAB — BASIC METABOLIC PANEL
BUN/Creatinine Ratio: 15 (ref 9–20)
BUN: 24 mg/dL (ref 6–24)
CO2: 30 mmol/L — ABNORMAL HIGH (ref 20–29)
Calcium: 9.6 mg/dL (ref 8.7–10.2)
Chloride: 93 mmol/L — ABNORMAL LOW (ref 96–106)
Creatinine, Ser: 1.65 mg/dL — ABNORMAL HIGH (ref 0.76–1.27)
Glucose: 257 mg/dL — ABNORMAL HIGH (ref 65–99)
Potassium: 4.1 mmol/L (ref 3.5–5.2)
Sodium: 138 mmol/L (ref 134–144)
eGFR: 52 mL/min/{1.73_m2} — ABNORMAL LOW (ref >59)

## 2020-10-27 LAB — ECHOCARDIOGRAM COMPLETE
Area-P 1/2: 7.74 cm2
S' Lateral: 3.9 cm

## 2020-10-27 LAB — MAGNESIUM: Magnesium: 1.8 mg/dL (ref 1.6–2.3)

## 2020-10-27 MED ORDER — METOLAZONE 5 MG PO TABS: 5.0000 mg | ORAL_TABLET | Freq: Every day | ORAL | 0 refills | Status: DC

## 2020-10-27 MED ORDER — ENTRESTO 24-26 MG PO TABS: 1.0000 | ORAL_TABLET | Freq: Two times a day (BID) | ORAL | 3 refills | Status: AC

## 2020-10-27 MED ORDER — FUROSEMIDE 40 MG PO TABS
40.0000 mg | ORAL_TABLET | Freq: Two times a day (BID) | ORAL | 3 refills | Status: DC
Start: 2020-10-27 — End: 2021-03-17

## 2020-10-27 MED ORDER — PERFLUTREN LIPID MICROSPHERE
1.0000 mL | INTRAVENOUS | Status: AC | PRN
  Administered 2020-10-27: 6 mL via INTRAVENOUS

## 2020-10-27 MED ORDER — CLONIDINE HCL 0.2 MG PO TABS: 0.2000 mg | ORAL_TABLET | Freq: Two times a day (BID) | ORAL | 3 refills | Status: DC

## 2020-10-27 NOTE — Progress Notes (Signed)
Complete echocardiogram with contrast performed.  Jimmy Caetano Oberhaus RDCS, RVT  

## 2020-10-27 NOTE — Patient Instructions (Addendum)
Medication Instructions:  Your physician has recommended you make the following change in your medication: INCREASE: Clonidine 0.2 mg twice daily STOP: Lisinopril wait (3 days on 4th day start Entresto 24-25 mg twice daily)  START: Entresto 24-25 mg twice daily  *If you need a refill on your cardiac medications before your next appointment, please call your pharmacy*   Lab Work: Your physician recommends that you return for lab work in: TODAY: BMET, Mag If you have labs (blood work) drawn today and your tests are completely normal, you will receive your results only by: Marland Kitchen MyChart Message (if you have MyChart) OR . A paper copy in the mail If you have any lab test that is abnormal or we need to change your treatment, we will call you to review the results.   Testing/Procedures: None   Follow-Up: At [facility], you and your health needs are our priority.  As part of our continuing mission to provide you with exceptional heart care, we have created designated Provider Care Teams.  These Care Teams include your primary Cardiologist (physician) and Advanced Practice Providers (APPs -  Physician Assistants and Nurse Practitioners) who all work together to provide you with the care you need, when you need it.  We recommend signing up for the patient portal called "MyChart".  Sign up information is provided on this After Visit Summary.  MyChart is used to connect with patients for Virtual Visits (Telemedicine).  Patients are able to view lab/test results, encounter notes, upcoming appointments, etc.  Non-urgent messages can be sent to your provider as well.   To learn more about what you can do with MyChart, go to [email or web address].    Your next appointment:   3 month(s)  The format for your next appointment:   In Person  Provider:   Thomasene Ripple, DO   Other Instructions

## 2020-10-27 NOTE — Progress Notes (Signed)
Cardiology Office Note:    Date:  10/28/2020   ID:  Troy Rogers, DOB 01-29-1976, MRN [id number]  PCP:  Buckner Malta, MD  Cardiologist:  Thomasene Ripple, DO  Electrophysiologist:  None   Referring MD: Buckner Malta, MD   I am still having some shortness of breath.  History of Present Illness:    Troy Rogers is a 45 y.o. male with a hx of hypertension, uncontrolled diabetes, depressed left ventricular ejection fraction, morbid obesity, OSA on CPAP.  I last saw the patient on October 03, 2020 at that time we increase his Lasix to twice a day for 7 days and back to Lasix 40 mg daily.  He was still pending his echocardiogram.  During that visit he had not been using his CPAP and we discussed possibly having the patient get a new sleep study.  Today he tells me that he had actually started reusing his CPAP.  He reports that he is still short of breath on exertion.  No chest pain.  Past Medical History:  Diagnosis Date  . Ankle contracture, left 07/02/2016  . Anxiety   . Asthma   . Carpal tunnel syndrome on left   . Cellulitis   . Controlled type 2 diabetes mellitus without complication, without long-term current use of insulin (HCC)   . Diabetes mellitus, type 2 (HCC)   . Dyspnea   . Essential hypertension 10/13/2014  . GERD (gastroesophageal reflux disease)    ocassional  . Gout   . Headache    hx migraines   . History of kidney stones    x 2  . Hypertension   . Irritable bowel syndrome (IBS)   . Lumbar radiculopathy 04/27/2015  . Morbid obesity (HCC) 10/13/2014  . Nephrolithiasis   . Obstructive sleep apnea    cpap   . Plantar fasciitis of left foot 07/02/2016  . Pneumonia   . Sciatica associated with disorder of lumbosacral spine   . Spondylolisthesis at L4-L5 level 07/19/2015    Past Surgical History:  Procedure Laterality Date  . CARPAL TUNNEL WITH CUBITAL TUNNEL Left 12/18/2019   Procedure: Left carpal tunnel release, left ulnar nerve release and anterior  transposition with flexor pronator lengthening as necessary;  Surgeon: Dominica Severin, MD;  Location: [facility] OR;  Service: Orthopedics;  Laterality: Left;  2 hrs  . Lumber fusion  06/2015   Lumbar 4  . STRABISMUS SURGERY Right 1983    Current Medications: Current Meds  Medication Sig  . acetaminophen (TYLENOL) 500 MG tablet Take 500 mg by mouth as needed for moderate pain.  Marland Kitchen allopurinol (ZYLOPRIM) 300 MG tablet Take 300 mg by mouth daily.  Marland Kitchen amitriptyline (ELAVIL) 75 MG tablet Take 75 mg by mouth at bedtime.  Marland Kitchen buPROPion (WELLBUTRIN SR) 150 MG 12 hr tablet Take 150 mg by mouth daily.  . carvedilol (COREG) 25 MG tablet Take 25 mg by mouth 2 (two) times daily with a meal.  . cloNIDine (CATAPRES) 0.2 MG tablet Take 1 tablet (0.2 mg total) by mouth 2 (two) times daily.  . Dulaglutide (TRULICITY) 0.75 MG/0.5ML SOPN Inject 0.75 mg into the skin daily.  Marland Kitchen escitalopram (LEXAPRO) 20 MG tablet Take 20 mg by mouth at bedtime.  . gabapentin (NEURONTIN) 300 MG capsule Take 300 mg by mouth 2 (two) times daily as needed (pain and numbess).  . hydrochlorothiazide (HYDRODIURIL) 25 MG tablet Take 25 mg by mouth daily.  . metFORMIN (GLUMETZA) 500 MG (MOD) 24 hr tablet Take 500 mg by  mouth 2 (two) times daily with a meal. With evening meal  . potassium chloride SA (KLOR-CON) 20 MEQ tablet Take 1 tablet (20 mEq total) by mouth daily.  . sacubitril-valsartan (ENTRESTO) 24-26 MG Take 1 tablet by mouth 2 (two) times daily.  Marland Kitchen Ubrogepant (UBRELVY) 50 MG TABS Take 50 mg by mouth daily as needed (Migraine).  . [DISCONTINUED] cloNIDine (CATAPRES) 0.1 MG tablet Take 0.2 mg by mouth 2 (two) times daily.  . [DISCONTINUED] furosemide (LASIX) 40 MG tablet Take 40 mg twice daily for 7 days than 40 mg once daily  . [DISCONTINUED] lisinopril (ZESTRIL) 40 MG tablet Take 40 mg by mouth in the morning and at bedtime.  . [DISCONTINUED] metolazone (ZAROXOLYN) 5 MG tablet Take 1 tablet (5 mg total) by mouth daily for 5 days. Take 30  minutes prior to your furosemide.  . [DISCONTINUED] omeprazole (PRILOSEC) 40 MG capsule Take 40 mg by mouth at bedtime.     Allergies:   Penicillins   Social History   Socioeconomic History  . Marital status: Married    Spouse name: Not on file  . Number of children: 0  . Years of education: Not on file  . Highest education level: Not on file  Occupational History  . Occupation: Apartment maintenance  Tobacco Use  . Smoking status: Never Smoker  . Smokeless tobacco: Never Used  Substance and Sexual Activity  . Alcohol use: Not Currently    Comment: Rare  . Drug use: No  . Sexual activity: Not on file  Other Topics Concern  . Not on file  Social History Narrative   Remarried June 2017   Social Determinants of Health   Financial Resource Strain: Not on file  Food Insecurity: Not on file  Transportation Needs: Not on file  Physical Activity: Not on file  Stress: Not on file  Social Connections: Not on file     Family History: The patient's family history includes COPD in his father and mother; Cancer in his maternal grandmother; Heart attack in his maternal grandfather, maternal grandmother, paternal grandfather, and paternal grandmother; Heart disease in his mother and paternal grandmother; Hypertension in his father, maternal grandfather, maternal grandmother, mother, paternal grandfather, and paternal grandmother; Stroke in his mother; Throat cancer in his maternal grandfather.  ROS:   Review of Systems  Constitution: Negative for decreased appetite, fever and weight gain.  HENT: Negative for congestion, ear discharge, hoarse voice and sore throat.   Eyes: Negative for discharge, redness, vision loss in right eye and visual halos.  Cardiovascular: Negative for chest pain, dyspnea on exertion, leg swelling, orthopnea and palpitations.  Respiratory: Negative for cough, hemoptysis, shortness of breath and snoring.   Endocrine: Negative for heat intolerance and  polyphagia.  Hematologic/Lymphatic: Negative for bleeding problem. Does not bruise/bleed easily.  Skin: Negative for flushing, nail changes, rash and suspicious lesions.  Musculoskeletal: Negative for arthritis, joint pain, muscle cramps, myalgias, neck pain and stiffness.  Gastrointestinal: Negative for abdominal pain, bowel incontinence, diarrhea and excessive appetite.  Genitourinary: Negative for decreased libido, genital sores and incomplete emptying.  Neurological: Negative for brief paralysis, focal weakness, headaches and loss of balance.  Psychiatric/Behavioral: Negative for altered mental status, depression and suicidal ideas.  Allergic/Immunologic: Negative for HIV exposure and persistent infections.    EKGs/Labs/Other Studies Reviewed:    The following studies were reviewed today:   EKG: None today  Transthoracic echocardiogram Oct 27, 2020 IMPRESSIONS    1. Left ventricular ejection fraction, by estimation, is 40 to  45%. The  left ventricle has mildly decreased function. The left ventricle  demonstrates global hypokinesis. Left ventricular diastolic parameters are  consistent with Grade I diastolic  dysfunction (impaired relaxation).  2. Right ventricular systolic function is normal. The right ventricular  size is normal.  3. The mitral valve is normal in structure. No evidence of mitral valve  regurgitation. No evidence of mitral stenosis.  4. The aortic valve is normal in structure. Aortic valve regurgitation is  not visualized. No aortic stenosis is present.  5. The inferior vena cava is normal in size with greater than 50%  respiratory variability, suggesting right atrial pressure of 3 mmHg.   Comparison(s): A prior study was performed on 01/21/2014. Ejection fraction  is now depressed; was 55-60% now 40-45%.   FINDINGS  Left Ventricle: Left ventricular ejection fraction, by estimation, is 40  to 45%. The left ventricle has mildly decreased function. The  left  ventricle demonstrates global hypokinesis. Definity contrast agent was  given IV to delineate the left ventricular  endocardial borders. The left ventricular internal cavity size was normal  in size. There is no left ventricular hypertrophy. Left ventricular  diastolic parameters are consistent with Grade I diastolic dysfunction  (impaired relaxation).   Right Ventricle: The right ventricular size is normal. No increase in  right ventricular wall thickness. Right ventricular systolic function is  normal.   Left Atrium: Left atrial size was normal in size.   Right Atrium: Right atrial size was normal in size.   Pericardium: There is no evidence of pericardial effusion.   Mitral Valve: The mitral valve is normal in structure. No evidence of  mitral valve regurgitation. No evidence of mitral valve stenosis.   Tricuspid Valve: The tricuspid valve is normal in structure. Tricuspid  valve regurgitation is not demonstrated. No evidence of tricuspid  stenosis.   Aortic Valve: The aortic valve is normal in structure. Aortic valve  regurgitation is not visualized. No aortic stenosis is present.   Pulmonic Valve: The pulmonic valve was normal in structure. Pulmonic valve  regurgitation is not visualized. No evidence of pulmonic stenosis.   Aorta: The aortic root is normal in size and structure.   Venous: The inferior vena cava is normal in size with greater than 50%  respiratory variability, suggesting right atrial pressure of 3 mmHg.   IAS/Shunts: No atrial level shunt detected by color flow Doppler.   Recent Labs: 12/18/2019: Hemoglobin 14.5; Platelets 249 10/03/2020: NT-Pro BNP 223 10/27/2020: BUN 24; Creatinine, Ser 1.65; Magnesium 1.8; Potassium 4.1; Sodium 138  Recent Lipid Panel    Component Value Date/Time   CHOL 155 01/23/2018 1542   TRIG 118.0 01/23/2018 1542   HDL 44.90 01/23/2018 1542   CHOLHDL 3 01/23/2018 1542   VLDL 23.6 01/23/2018 1542   LDLCALC 87 01/23/2018  1542    Physical Exam:    VS:  BP (!) 158/118   Pulse 90   Ht 5\' 6"  (1.676 m)   Wt (!) 365 lb 3.2 oz (165.7 kg)   SpO2 96%   BMI 58.94 kg/m     Wt Readings from Last 3 Encounters:  10/27/20 (!) 365 lb 3.2 oz (165.7 kg)  10/03/20 (!) 374 lb 12.8 oz (170 kg)  09/19/20 (!) 376 lb (170.6 kg)     GEN: Well nourished, well developed in no acute distress HEENT: Normal NECK: No JVD; No carotid bruits LYMPHATICS: No lymphadenopathy CARDIAC: S1S2 noted,RRR, no murmurs, rubs, gallops RESPIRATORY:  Clear to auscultation without rales, wheezing  or rhonchi  ABDOMEN: Soft, non-tender, non-distended, +bowel sounds, no guarding. EXTREMITIES: No edema, No cyanosis, no clubbing MUSCULOSKELETAL:  No deformity  SKIN: Warm and dry NEUROLOGIC:  Alert and oriented x 3, non-focal PSYCHIATRIC:  Normal affect, good insight  ASSESSMENT:    1. Hypertension, unspecified type   2. Depressed left ventricular ejection fraction   3. Essential hypertension   4. Controlled type 2 diabetes mellitus without complication, without long-term current use of insulin (HCC)   5. Morbid obesity (HCC)   6. Obstructive sleep apnea    PLAN:      I discussed with the patient and his wife who was in the room informing him that his ejection fraction had decreased from 1 if his echocardiogram was done in 2015 he is now 40 to 45%.  We also talked about medication changes.  He is on lisinopril I am stopping that.  Asked the patient with 3 days and he will start his Entresto 24- 26 mg twice a day BMP, mag will be done today.  He continue to take the Lasix twice a day because he notes that when he takes his once a day he has significant swelling.  I am also increasing his clonidine 0.2 mg twice a day, I advised the patient about skipping clonidine and the potential for rebound hypertension. We also discussed the potential for considering weight loss.  For now he would like to see someone in our healthy weight loss clinic.   He may be a candidate for bariatric surgery.  He tells me he started using his CPAP again.    The patient is in agreement with the above plan. The patient left the office in stable condition.  The patient will follow up in   Medication Adjustments/Labs and Tests Ordered: Current medicines are reviewed at length with the patient today.  Concerns regarding medicines are outlined above.  Orders Placed This Encounter  Procedures  . Basic metabolic panel  . Magnesium  . Amb Ref to Medical Weight Management   Meds ordered this encounter  Medications  . sacubitril-valsartan (ENTRESTO) 24-26 MG    Sig: Take 1 tablet by mouth 2 (two) times daily.    Dispense:  180 tablet    Refill:  3  . furosemide (LASIX) 40 MG tablet    Sig: Take 1 tablet (40 mg total) by mouth 2 (two) times daily.    Dispense:  180 tablet    Refill:  3  . cloNIDine (CATAPRES) 0.2 MG tablet    Sig: Take 1 tablet (0.2 mg total) by mouth 2 (two) times daily.    Dispense:  180 tablet    Refill:  3  . metolazone (ZAROXOLYN) 5 MG tablet    Sig: Take 1 tablet (5 mg total) by mouth daily for 5 days. Take 30 minutes prior to your first dose of furosemide (Lasix).    Dispense:  5 tablet    Refill:  0    Patient Instructions  Medication Instructions:  Your physician has recommended you make the following change in your medication: INCREASE: Clonidine 0.2 mg twice daily STOP: Lisinopril wait (3 days on 4th day start Entresto 24-25 mg twice daily)  START: Entresto 24-25 mg twice daily  *If you need a refill on your cardiac medications before your next appointment, please call your pharmacy*   Lab Work: Your physician recommends that you return for lab work in: TODAY: BMET, Mag If you have labs (blood work) drawn today and your tests are  completely normal, you will receive your results only by: Marland Kitchen. MyChart Message (if you have MyChart) OR . A paper copy in the mail If you have any lab test that is abnormal or we need to  change your treatment, we will call you to review the results.   Testing/Procedures: None   Follow-Up: At [facility], you and your health needs are our priority.  As part of our continuing mission to provide you with exceptional heart care, we have created designated Provider Care Teams.  These Care Teams include your primary Cardiologist (physician) and Advanced Practice Providers (APPs -  Physician Assistants and Nurse Practitioners) who all work together to provide you with the care you need, when you need it.  We recommend signing up for the patient portal called "MyChart".  Sign up information is provided on this After Visit Summary.  MyChart is used to connect with patients for Virtual Visits (Telemedicine).  Patients are able to view lab/test results, encounter notes, upcoming appointments, etc.  Non-urgent messages can be sent to your provider as well.   To learn more about what you can do with MyChart, go to [email or web address].    Your next appointment:   3 month(s)  The format for your next appointment:   In Person  Provider:   Thomasene RippleKardie Salik Grewell, DO   Other Instructions      Adopting a Healthy Lifestyle.  Know what a healthy weight is for you (roughly BMI <25) and aim to maintain this   Aim for 7+ servings of fruits and vegetables daily   65-80+ fluid ounces of water or unsweet tea for healthy kidneys   Limit to max 1 drink of alcohol per day; avoid smoking/tobacco   Limit animal fats in diet for cholesterol and heart health - choose grass fed whenever available   Avoid highly processed foods, and foods high in saturated/trans fats   Aim for low stress - take time to unwind and care for your mental health   Aim for 150 min of moderate intensity exercise weekly for heart health, and weights twice weekly for bone health   Aim for 7-9 hours of sleep daily   When it comes to diets, agreement about the perfect plan isnt easy to find, even among the  experts. Experts at the [employer] developed an idea known as the Healthy Eating Plate. Just imagine a plate divided into logical, healthy portions.   The emphasis is on diet quality:   Load up on vegetables and fruits - one-half of your plate: Aim for color and variety, and remember that potatoes dont count.   Go for whole grains - one-quarter of your plate: Whole wheat, barley, wheat berries, quinoa, oats, brown rice, and foods made with them. If you want pasta, go with whole wheat pasta.   Protein power - one-quarter of your plate: Fish, chicken, beans, and nuts are all healthy, versatile protein sources. Limit red meat.   The diet, however, does go beyond the plate, offering a few other suggestions.   Use healthy plant oils, such as olive, canola, soy, corn, sunflower and peanut. Check the labels, and avoid partially hydrogenated oil, which have unhealthy trans fats.   If youre thirsty, drink water. Coffee and tea are good in moderation, but skip sugary drinks and limit milk and dairy products to one or two daily servings.   The type of carbohydrate in the diet is more important than the amount. Some sources of carbohydrates, such as  vegetables, fruits, whole grains, and beans-are healthier than others.   Finally, stay active  Signed, Thomasene Ripple, DO  10/28/2020 6:42 PM    [facility]

## 2020-10-31 ENCOUNTER — Telehealth: Payer: Self-pay | Admitting: Cardiology

## 2020-10-31 NOTE — Telephone Encounter (Signed)
Pt c/o medication issue:  1. Name of Medication:  sacubitril-valsartan (ENTRESTO) 24-26 MG 90 day supply  2. How are you currently taking this medication (dosage and times per day)?  Patient has not started this medication.  3. Are you having a reaction (difficulty breathing--STAT)?  No   4. What is your medication issue?   Patient's wife states the medication will cost them around $250/3 months even with insurance. She states they are not able to afford it and she wants to know if the patient can take an alternative or if we offer assistance with purchasing. Please advise.

## 2020-11-01 NOTE — Telephone Encounter (Signed)
Spoke with pt's wife per DPR and co-pay card left pt pt/wife to pick up.

## 2020-11-01 NOTE — Telephone Encounter (Signed)
Patient's wife is following up regarding patient's med issue. Please advise when able.

## 2021-01-09 ENCOUNTER — Telehealth: Payer: Self-pay | Admitting: Cardiology

## 2021-01-09 MED ORDER — CLONIDINE HCL 0.2 MG PO TABS: 0.2000 mg | ORAL_TABLET | Freq: Every day | ORAL | 3 refills | Status: DC

## 2021-01-09 NOTE — Telephone Encounter (Signed)
Pt's wife  Efraim Kaufmann is calling back with more questions in regards to pts' med's. Please advise

## 2021-01-09 NOTE — Telephone Encounter (Signed)
Spoke to the patients wife and let her know Dr. Hulen Shouts recommendation to discontinue the medication completely. She verbalizes understanding and thanks me for calling her back.    Encouraged patient to call back with any questions or concerns.

## 2021-01-09 NOTE — Telephone Encounter (Signed)
Patient's wife called during sytem outage. She states the patient's BP has been dropping to 80/50. She states he gets dizzy, faints, and loses his hearing. She states he was not dizzy at the time of the call. She states it started and continued to get worse after getting on his heart failure medication. She did not remember the name of the medication.

## 2021-01-09 NOTE — Telephone Encounter (Signed)
Spoke to the patients wife just now and let her know Dr. Hulen Shouts recommendations. She verbalizes understanding and thanks me for the calling her back.

## 2021-01-09 NOTE — Addendum Note (Signed)
Addended by: Delorse Limber I on: 01/09/2021 12:43 PM   Modules accepted: Orders

## 2021-01-09 NOTE — Telephone Encounter (Signed)
Spoke to patients wife just now and she let me know that Troy Rogers has only been taking his clonidine once daily. They did not realize this earlier when we spoke. She is wondering if he should discontinue it completely or not.   He is aware to hold the entresto if his blood pressure is less than 110 systolic.

## 2021-02-08 ENCOUNTER — Ambulatory Visit: Payer: 59 | Admitting: Cardiology

## 2021-03-17 ENCOUNTER — Telehealth: Payer: Self-pay | Admitting: Cardiology

## 2021-03-17 MED ORDER — FUROSEMIDE 40 MG PO TABS: 40.0000 mg | ORAL_TABLET | Freq: Two times a day (BID) | ORAL | 3 refills | Status: AC

## 2021-03-17 NOTE — Telephone Encounter (Signed)
Spoke to the patients wife just now and let her know Dr. Mallory Shirk recommendations. She verbalizes understanding and thanks me for the call back.

## 2021-03-17 NOTE — Telephone Encounter (Signed)
Pt c/o medication issue:  1. Name of Medication:   furosemide (LASIX) 40 MG tablet    2. How are you currently taking this medication (dosage and times per day)?  As prescribed, per patient's wife   3. Are you having a reaction (difficulty breathing--STAT)?  No   4. What is your medication issue?   Patient's wife states the patient takes this medication, but his PCP also has him on Hydrochlorothiazide. She would like a call back to discuss whether or not taking both medications is safe.

## 2021-04-12 ENCOUNTER — Other Ambulatory Visit (HOSPITAL_COMMUNITY): Payer: Self-pay | Admitting: Registered Nurse

## 2021-04-12 DIAGNOSIS — R519 Headache, unspecified: Secondary | ICD-10-CM

## 2021-04-12 DIAGNOSIS — G8929 Other chronic pain: Secondary | ICD-10-CM

## 2021-04-12 DIAGNOSIS — R2681 Unsteadiness on feet: Secondary | ICD-10-CM

## 2021-04-13 ENCOUNTER — Ambulatory Visit (HOSPITAL_COMMUNITY): Payer: 59

## 2021-04-13 ENCOUNTER — Encounter (HOSPITAL_COMMUNITY): Payer: Self-pay

## 2021-05-16 ENCOUNTER — Telehealth: Payer: Self-pay

## 2021-05-16 NOTE — Telephone Encounter (Signed)
Letter has been sent to patient informing them that their sleep study has expired. Patient will need to call and schedule an office visit to re-evaluate the need for a sleep study.    

## 2021-05-24 ENCOUNTER — Other Ambulatory Visit (HOSPITAL_COMMUNITY): Payer: Self-pay | Admitting: Family Medicine

## 2021-05-24 ENCOUNTER — Ambulatory Visit (HOSPITAL_COMMUNITY)
Admission: RE | Admit: 2021-05-24 | Discharge: 2021-05-24 | Disposition: A | Discharge status: Home / Self Care | Payer: 59 | Source: Ambulatory Visit | Attending: Family Medicine | Admitting: Family Medicine

## 2021-05-24 ENCOUNTER — Other Ambulatory Visit: Payer: Self-pay

## 2021-05-24 ENCOUNTER — Other Ambulatory Visit: Payer: Self-pay | Admitting: Family Medicine

## 2021-05-24 DIAGNOSIS — R296 Repeated falls: Secondary | ICD-10-CM | POA: Insufficient documentation

## 2021-05-24 MED ORDER — GADOBUTROL 1 MMOL/ML IV SOLN
10.0000 mL | Freq: Once | INTRAVENOUS | Status: AC | PRN
  Administered 2021-05-24: 10 mL via INTRAVENOUS

## 2021-06-27 ENCOUNTER — Ambulatory Visit (INDEPENDENT_AMBULATORY_CARE_PROVIDER_SITE_OTHER): Payer: Commercial Indemnity | Admitting: Psychiatry

## 2021-06-27 ENCOUNTER — Encounter: Payer: Self-pay | Admitting: Psychiatry

## 2021-06-27 ENCOUNTER — Other Ambulatory Visit: Payer: Self-pay

## 2021-06-27 VITALS — BP 128/84 | HR 98 | Ht 66.0 in | Wt 349.0 lb

## 2021-06-27 DIAGNOSIS — G43719 Chronic migraine without aura, intractable, without status migrainosus: Secondary | ICD-10-CM | POA: Diagnosis not present

## 2021-06-27 DIAGNOSIS — R55 Syncope and collapse: Secondary | ICD-10-CM | POA: Diagnosis not present

## 2021-06-27 DIAGNOSIS — G4733 Obstructive sleep apnea (adult) (pediatric): Secondary | ICD-10-CM | POA: Diagnosis not present

## 2021-06-27 DIAGNOSIS — R42 Dizziness and giddiness: Secondary | ICD-10-CM

## 2021-06-27 MED ORDER — AJOVY 225 MG/1.5ML ~~LOC~~ SOAJ: 225.0000 mg | SUBCUTANEOUS | 2 refills | Status: AC

## 2021-06-27 NOTE — Patient Instructions (Addendum)
Start Ajovy for headache prevention Wear CPAP consistently  Guidelines for the Management of Lightheadedness 1. Make all postural changes from lying to sitting or sitting to standing, slowly.  2. Drink to 2.0 -2.5 L of fluids per day.  4. Avoid large meals which can cause low blood pressure during digestion. It is better to eat smaller meals more often than three large meals.  5. Avoid alcohol. Alcohol and cause blood to pool in the legs which may worsen low blood pressure reactions when standing.  6. Perform lower extremity exercises to improve strength of the leg muscles. This will help prevent blood from a pooling in the legs when standing and walking.  7. Raise the head of the bed by 6 to 10 inches. The entire bed must be at an angle. Raising only the head portion of the bed at waist level or using pillows will not be effective. Raising the head of the bed will reduce urine formation overnight and there will be more volume in the circulation in the morning.  8. During bad days, drink 500 cc of water quickly. This will result in an increased blood pressure within 5 minutes of drinking the water. The effect will last up to one hour and may improve orthostatic intolerance.  9. Use custom fitted elastic support stockings. These will reduce a tendency for blood to pool in the legs when standing and may improve orthostatic intolerance.  10. Use physical counter maneuvers such as leg crossing, squatting, or raising and resting the leg on a chair. These maneuvers increase blood pressure and can improve orthostatic intolerance.  11. Regular aerobic exercise as tolerated

## 2021-06-27 NOTE — Progress Notes (Signed)
[facility]  PATIENT: Troy Rogers DOB: 19-Feb-1976  REFERRING CLINICIAN: Maurie Boettcher Rogers, F* HISTORY FROM: self, wife REASON FOR VISIT: headaches, brain fog, dizziness   HISTORICAL  CHIEF COMPLAINT:  Chief Complaint  Patient presents with   New Patient (Initial Visit)    Rm 1 with wife Troy Rogers, here for consult on worsening headaches with associated dizziness, unsteadiness, and brain fog. Pt reports dizziness is troublesome for him reports sx have been going on now      HISTORY OF PRESENT ILLNESS:  The patient presents for evaluation of headaches associated with dizziness, unsteadiness, syncope, and brain fog for the past year. This started with occasional dizzy spells but has progressively worsened over time. No inciting incident that he can think of. He does have a history of poorly controlled diabetes with glucose in the 500s. Currently glucose runs from 150-230. He does report early satiety which has been new in the past few months.  Headaches are constant 3-4/10 holocephalic pain with migraines described as 10/10 pain with associated photophobia, phonophobia, and occasional nausea. Has migraines 1-2 times per week. Migraines resolve with Bernita Raisin. He also takes elavil 75 mg QHS, lexapro 20 mg daily, and gabapentin 300 mg BID. Takes carvedilol for blood pressure.  Feels very unsteady on his feet. Had dizzy spells daily, sometimes multiple times per day. These have occurred with prolonged standing, urination, and driving. Hearing will become muffled, will feel his heart is pounding, starts sweating, feels like the room is spinning around him, then he will have to sit down for a few minutes. If he does not sit down he will pass out. Only blacks out for a few seconds and does not have any prolonged confusion. No associated tongue biting, urinary/bowel incontinence, or jerking.  Has noticed word finding difficulty, will occasionally forget how to spell simple  words. Will forget things he said in conversations.  Presented to the ED 04/19/21 for dizziness and altered mental status. Blood glucose was in the 400s. 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 MRI brain 05/24/21 showed white matter ischemic changes but was otherwise normal. Otherwise was unremarkable.  Has a history of OSA but does not use his CPAP because he likes to sit on his stomach. Has not used his CPAP for the past month, and notes symptoms have worsened over the past 6 weeks or so.  OTHER MEDICAL CONDITIONS: poorly controlled DM, OSA, HTN, gout, nephrolithiasis   REVIEW OF SYSTEMS: Full 14 system review of systems performed and negative with exception of: headaches, dizziness  ALLERGIES: Allergies  Allergen Reactions   Penicillins Hives    Has patient had a PCN reaction causing immediate rash, facial/tongue/throat swelling, SOB or lightheadedness with hypotension: Yes Has patient had a PCN reaction causing severe rash involving mucus membranes or skin necrosis: No Has patient had a PCN reaction that required hospitalization No Has patient had a PCN reaction occurring within the last 10 years: No If all of the above answers are "NO", then may proceed with Cephalosporin use.     HOME MEDICATIONS: Outpatient Medications Prior to Visit  Medication Sig Dispense Refill   acetaminophen (TYLENOL) 500 MG tablet Take 500 mg by mouth as needed for moderate pain.     allopurinol (ZYLOPRIM) 300 MG tablet Take 300 mg by mouth daily.     amitriptyline (ELAVIL) 75 MG tablet Take 75 mg by mouth at bedtime.     buPROPion [facility] SR) 150  MG 12 hr tablet Take 150 mg by mouth daily.     carvedilol (COREG) 25 MG tablet Take 25 mg by mouth 2 (two) times daily with a meal.     Dulaglutide (TRULICITY) 0.75 MG/0.5ML SOPN Inject 0.75 mg into the skin daily.     escitalopram (LEXAPRO) 20 MG tablet Take 20 mg by mouth at bedtime.      furosemide (LASIX) 40 MG tablet Take 1 tablet (40 mg total) by mouth 2 (two) times daily. 180 tablet 3   gabapentin (NEURONTIN) 300 MG capsule Take 300 mg by mouth 2 (two) times daily as needed (pain and numbess).     hydrochlorothiazide (HYDRODIURIL) 25 MG tablet Take 25 mg by mouth daily.     Insulin Degludec 100 UNIT/ML SOLN Inject 3 Units into the skin 3 (three) times daily.     mupirocin ointment (BACTROBAN) 2 % 1 application 2 (two) times daily.     omeprazole (PRILOSEC) 40 MG capsule Take 40 mg by mouth daily.     sacubitril-valsartan (ENTRESTO) 24-26 MG Take 1 tablet by mouth 2 (two) times daily. 180 tablet 3   Semaglutide (OZEMPIC, 1 MG/DOSE, E[place]ice) Inject into the skin.     torsemide (DEMADEX) 10 MG tablet Take 10 mg by mouth daily.     Ubrogepant (UBRELVY) 50 MG TABS Take 50 mg by mouth daily as needed (Migraine).     metFORMIN (GLUMETZA) 500 MG (MOD) 24 hr tablet Take 500 mg by mouth 2 (two) times daily with a meal. With evening meal     metolazone (ZAROXOLYN) 5 MG tablet Take 1 tablet (5 mg total) by mouth daily for 5 days. Take 30 minutes prior to your first dose of furosemide (Lasix). 5 tablet 0   potassium chloride SA (KLOR-CON) 20 MEQ tablet Take 1 tablet (20 mEq total) by mouth daily. 90 tablet 3   No facility-administered medications prior to visit.    PAST MEDICAL HISTORY: Past Medical History:  Diagnosis Date   Ankle contracture, left 07/02/2016   Anxiety    Asthma    Carpal tunnel syndrome on left    Cellulitis    Controlled type 2 diabetes mellitus without complication, without long-term current use of insulin (HCC)    Diabetes mellitus, type 2 (HCC)    Dyspnea    Essential hypertension 10/13/2014   GERD (gastroesophageal reflux disease)    ocassional   Gout    Headache    hx migraines    History of kidney stones    x 2   Hypertension    Irritable bowel syndrome (IBS)    Lumbar radiculopathy 04/27/2015   Morbid obesity (HCC) 10/13/2014   Nephrolithiasis     Obstructive sleep apnea    cpap    Plantar fasciitis of left foot 07/02/2016   Pneumonia    Sciatica associated with disorder of lumbosacral spine    Spondylolisthesis at L4-L5 level 07/19/2015    PAST SURGICAL HISTORY: Past Surgical History:  Procedure Laterality Date   CARPAL TUNNEL WITH CUBITAL TUNNEL Left 12/18/2019   Procedure: Left carpal tunnel release, left ulnar nerve release and anterior transposition with flexor pronator lengthening as necessary;  Surgeon: Dominica SeverinGramig, William, MD;  Location: [facility] OR;  Service: Orthopedics;  Laterality: Left;  2 hrs   Lumber fusion  06/2015   Lumbar 4   STRABISMUS SURGERY Right 1983    FAMILY HISTORY: Family History  Problem Relation Age of Onset   Heart disease Mother    COPD Mother  Stroke Mother        TIA vs very mild   Hypertension Mother    Transient ischemic attack Mother    COPD Father    Hypertension Father    Cancer Maternal Grandmother        in throat   Heart attack Maternal Grandmother    Hypertension Maternal Grandmother    COPD Maternal Grandfather    Heart attack Maternal Grandfather    Hypertension Maternal Grandfather    Throat cancer Maternal Grandfather    COPD Paternal Grandmother    Heart disease Paternal Grandmother    Hypertension Paternal Grandmother    Heart attack Paternal Grandmother    Heart attack Paternal Grandfather    Hypertension Paternal Grandfather     SOCIAL HISTORY: Social History   Socioeconomic History   Marital status: Married    Spouse name: Not on file   Number of children: 0   Years of education: Not on file   Highest education level: Not on file  Occupational History   Occupation: Apartment maintenance  Tobacco Use   Smoking status: Never   Smokeless tobacco: Never  Substance and Sexual Activity   Alcohol use: Not Currently    Comment: Rare   Drug use: No   Sexual activity: Not on file  Other Topics Concern   Not on file  Social History Narrative   Remarried June 2017    Right handed    Caffeine one per day     Social Determinants of Health   Financial Resource Strain: Not on file  Food Insecurity: Not on file  Transportation Needs: Not on file  Physical Activity: Not on file  Stress: Not on file  Social Connections: Not on file  Intimate Partner Violence: Not on file     PHYSICAL EXAM  GENERAL EXAM/CONSTITUTIONAL: Vitals:  Vitals:   06/27/21 1022  BP: 128/84  Pulse: 98  SpO2: 98%  Weight: (!) 349 lb (158.3 kg)  Height: 5\' 6"  (1.676 m)   Body mass index is 56.33 kg/m. Wt Readings from Last 3 Encounters:  06/27/21 (!) 349 lb (158.3 kg)  10/27/20 (!) 365 lb 3.2 oz (165.7 kg)  10/03/20 (!) 374 lb 12.8 oz (170 kg)   Patient is well developed, nourished and groomed; appears highly anxious  CARDIOVASCULAR: Examination of peripheral vascular system by observation and palpation is normal  EYES: Pupils round and reactive to light, Visual fields full to confrontation, Extraocular movements intacts,   MUSCULOSKELETAL: Gait, strength, tone, movements noted in Neurologic exam below  NEUROLOGIC: MENTAL STATUS:  awake, alert, oriented to person, place and time recent and remote memory intact normal attention and concentration  CRANIAL NERVE:  2nd, 3rd, 4th, 6th - pupils equal and reactive to light, visual fields full to confrontation, extraocular muscles intact, no nystagmus 5th - facial sensation symmetric 7th - facial strength symmetric 8th - hearing intact 9th - palate elevates symmetrically, uvula midline 11th - shoulder shrug symmetric 12th - tongue protrusion midline  MOTOR:  normal bulk and tone, full strength in the BUE, BLE  SENSORY:  Diminished sensation to pinprick over bilateral fingers  COORDINATION:  finger-nose-finger, heel-to-shin intact bilaterally  REFLEXES:  deep tendon reflexes 1+ throughout GAIT/STATION:  normal     DIAGNOSTIC DATA (LABS, IMAGING, TESTING) - I reviewed patient records, labs, notes,  testing and imaging myself where available.  Lab Results  Component Value Date   WBC 7.8 12/18/2019   HGB 14.5 12/18/2019   HCT 43.5 12/18/2019  MCV 92.2 12/18/2019   PLT 249 12/18/2019      Component Value Date/Time   NA 138 10/27/2020 1029   NA 139 11/24/2013 2246   K 4.1 10/27/2020 1029   K 3.0 (L) 11/24/2013 2246   CL 93 (L) 10/27/2020 1029   CL 105 11/24/2013 2246   CO2 30 (H) 10/27/2020 1029   CO2 26 11/24/2013 2246   GLUCOSE 257 (H) 10/27/2020 1029   GLUCOSE 164 (H) 12/18/2019 1122   GLUCOSE 141 (H) 11/24/2013 2246   BUN 24 10/27/2020 1029   BUN 15 11/24/2013 2246   CREATININE 1.65 (H) 10/27/2020 1029   CREATININE 1.42 (H) 11/24/2013 2246   CALCIUM 9.6 10/27/2020 1029   CALCIUM 9.0 11/24/2013 2246   PROT 7.5 01/23/2018 1542   ALBUMIN 4.2 01/23/2018 1542   AST 21 01/23/2018 1542   ALT 23 01/23/2018 1542   ALKPHOS 110 01/23/2018 1542   BILITOT 0.4 01/23/2018 1542   GFRNONAA >60 12/18/2019 1122   GFRNONAA >60 11/24/2013 2246   GFRAA >60 12/18/2019 1122   GFRAA >60 11/24/2013 2246   Lab Results  Component Value Date   CHOL 155 01/23/2018   HDL 44.90 01/23/2018   LDLCALC 87 01/23/2018   TRIG 118.0 01/23/2018   CHOLHDL 3 01/23/2018   Lab Results  Component Value Date   HGBA1C 7.3 (A) 01/23/2018   No results found for: VITAMINB12 No results found for: TSH  04/07/21 glucose 508, A1c 15.9 11/18/20 TSH 3.598  CTH 05/24/21: unremarkable  ASSESSMENT AND PLAN  46 y.o. year old male with a history of poorly controlled DM, OSA not on CPAP, HTN, gout, nephrolithiasis who presents for evaluation of headaches, dizziness, and brain fog over the past year.  MRI brain unremarkable. Suspect the cause of his symptoms are multifactorial including untreated OSA, poorly controlled DM with blood sugars up to 500s, severe anxiety, and migraine headaches. Dizziness sounds most consistent with presyncope though he does also endorse some vertigo as well. Discussed that he may  have a component of diabetic autonomic neuropathy contributing to orthostatic intolerance. Provided a list of lifestyle measures to help reduce orthostatic intolerance. Will start Ajovy for headache management and see if this helps reduce his dizziness and brain fog. Encouraged patient to use his CPAP regularly.   1. Intractable chronic migraine without aura and without status migrainosus   2. Postural dizziness with presyncope   3. Obstructive sleep apnea       PLAN: -Start Ajovy for migraine prevention. Continue Ubrelvy for rescue -Encouraged patient to use CPAP regularly -Lifestyle measures to help with orthostatic intolerance provided  Meds ordered this encounter  Medications   Fremanezumab-vfrm (AJOVY) 225 MG/1.5ML SOAJ    Sig: Inject 225 mg into the skin every 30 (thirty) days.    Dispense:  1.68 mL    Refill:  2    Return in about 3 months (around 09/25/2021).    Ocie Doyne, MD 06/27/21 11:21 AM  I spent an average of 56 minutes chart reviewing and counseling the patient, with at least 50% of the time face to face with the patient.   [facility] [address], [address] [place], [place] [address] [phone]

## 2021-09-25 ENCOUNTER — Ambulatory Visit: Payer: Commercial Indemnity | Admitting: Psychiatry

## 2021-09-25 ENCOUNTER — Encounter: Payer: Self-pay | Admitting: Adult Health

## 2022-05-22 ENCOUNTER — Encounter: Payer: Self-pay | Admitting: Gastroenterology

## 2022-06-20 ENCOUNTER — Ambulatory Visit: Payer: Self-pay | Admitting: Nurse Practitioner

## 2022-07-09 ENCOUNTER — Ambulatory Visit: Payer: Self-pay | Admitting: Gastroenterology

## 2022-08-10 DIAGNOSIS — M549 Dorsalgia, unspecified: Secondary | ICD-10-CM | POA: Diagnosis not present

## 2022-08-10 DIAGNOSIS — M47816 Spondylosis without myelopathy or radiculopathy, lumbar region: Secondary | ICD-10-CM | POA: Diagnosis not present

## 2022-08-10 DIAGNOSIS — Z1389 Encounter for screening for other disorder: Secondary | ICD-10-CM | POA: Diagnosis not present

## 2022-08-10 DIAGNOSIS — G894 Chronic pain syndrome: Secondary | ICD-10-CM | POA: Diagnosis not present

## 2022-12-25 ENCOUNTER — Telehealth: Payer: Self-pay

## 2022-12-25 NOTE — Patient Outreach (Signed)
  Care Coordination   12/25/2022 Name: Troy Rogers MRN: [id number] DOB: 08/01/1975   Care Coordination Outreach Attempts:  An unsuccessful telephone outreach was attempted today to offer the patient information about available care coordination services.  Follow Up Plan:  Additional outreach attempts will be made to offer the patient care coordination information and services.   Encounter Outcome:  No Answer   Care Coordination Interventions:  No, not indicated    Rowe Pavy, RN, BSN, [facility] [employer] [phone]

## 2022-12-26 ENCOUNTER — Telehealth: Payer: Self-pay

## 2022-12-26 NOTE — Patient Outreach (Signed)
  Care Coordination   12/26/2022 Name: Troy Rogers MRN: [id number] DOB: [birth year]   Care Coordination Outreach Attempts:  A second unsuccessful outreach was attempted today to offer the patient with information about available care coordination services.  Follow Up Plan:  Additional outreach attempts will be made to offer the patient care coordination information and services.   Encounter Outcome:  No Answer   Care Coordination Interventions:  No, not indicated    Rowe Pavy, RN, BSN, [facility] [employer] [phone]

## 2023-11-08 ENCOUNTER — Encounter: Payer: Self-pay | Admitting: General Practice

## 2024-03-12 ENCOUNTER — Telehealth: Payer: Self-pay

## 2024-03-12 NOTE — Telephone Encounter (Signed)
 Received completed Patient assistance application for Ozempic and Tresiba through [employer] from [place].  Faxed completed application to [employer]

## 2024-03-16 NOTE — Telephone Encounter (Signed)
 PAP: Patient assistance application for Ozempic and Tresiba has been approved by PAP Companies: [employer] from 03/13/2024 to 06/24/2024. Medication should be delivered to PAP Delivery: Home. For further shipping updates, please contact [employer] at [phone]. Patient ID is: [id number]

## 2024-03-16 NOTE — Progress Notes (Signed)
 Pharmacy Medication Assistance Program Note    03/16/2024  Patient ID: Troy Rogers, male   DOB: 04-Jan-1976, 48 y.o.   MRN: [id number]     03/12/2024  Outreach Medication One  Initial Outreach Date (Medication One) 03/12/2024  Manufacturer Medication One [employer] [facility] [place]  Dose of Ozempic 2mg   Dose of Tresiba u100  Type of Radiographer, therapeutic Assistance  Name of Prescriber Delon Contes  Date Application Received From Patient 03/12/2024  Date Application Received From Provider 03/12/2024  Date Application Submitted to Manufacturer 03/12/2024  Patient Assistance Determination Approved  Approval Start Date 03/13/2024  Approval End Date 06/24/2024  Patient Notification Method Telephone Call     Signature

## 2024-03-22 ENCOUNTER — Encounter (HOSPITAL_COMMUNITY): Payer: Self-pay

## 2024-06-22 ENCOUNTER — Telehealth: Payer: Self-pay

## 2024-06-22 NOTE — Telephone Encounter (Signed)
 Completed reorder form for ozempic and faxed to Dr. Delon Contes at [facility] to review and sign

## 2024-06-22 NOTE — Telephone Encounter (Signed)
 Faxed completed and signed reorder form to [employer]

## 2024-06-22 NOTE — Telephone Encounter (Signed)
 PAP: Application for Marcelline Deist has been submitted to [employer] ([employer]), via fax

## 2024-06-22 NOTE — Telephone Encounter (Signed)
 Completed  patient assistance application for Farxiga through [employer], and faxed to [facility] to have patient and provider review and sign.

## 2024-06-23 NOTE — Telephone Encounter (Signed)
 PAP: Patient assistance application for Farxiga has been approved by PAP Companies: [employer] from 06/25/2024 to 06/24/2025. Medication should be delivered to PAP Delivery: Home. For further shipping updates, please contact [employer] ([employer] at [phone]. Patient ID is: [id number]
